# Patient Record
Sex: Female | Born: 1955 | Race: White | Hispanic: No | Marital: Married | Smoking: Former smoker
Health system: Southern US, Community
[De-identification: ages and names within clinical notes are randomized; demographics above are authoritative.]

## PROBLEM LIST (undated history)

## (undated) DIAGNOSIS — M1990 Unspecified osteoarthritis, unspecified site: Secondary | ICD-10-CM

## (undated) DIAGNOSIS — F32A Depression, unspecified: Secondary | ICD-10-CM

## (undated) DIAGNOSIS — J302 Other seasonal allergic rhinitis: Secondary | ICD-10-CM

## (undated) DIAGNOSIS — E785 Hyperlipidemia, unspecified: Secondary | ICD-10-CM

## (undated) DIAGNOSIS — L4050 Arthropathic psoriasis, unspecified: Secondary | ICD-10-CM

## (undated) DIAGNOSIS — H9190 Unspecified hearing loss, unspecified ear: Secondary | ICD-10-CM

## (undated) DIAGNOSIS — F419 Anxiety disorder, unspecified: Secondary | ICD-10-CM

## (undated) DIAGNOSIS — R011 Cardiac murmur, unspecified: Secondary | ICD-10-CM

## (undated) DIAGNOSIS — R7303 Prediabetes: Secondary | ICD-10-CM

## (undated) DIAGNOSIS — L409 Psoriasis, unspecified: Secondary | ICD-10-CM

## (undated) DIAGNOSIS — G4730 Sleep apnea, unspecified: Secondary | ICD-10-CM

## (undated) DIAGNOSIS — E119 Type 2 diabetes mellitus without complications: Secondary | ICD-10-CM

## (undated) DIAGNOSIS — Z87442 Personal history of urinary calculi: Secondary | ICD-10-CM

## (undated) DIAGNOSIS — I10 Essential (primary) hypertension: Secondary | ICD-10-CM

## (undated) HISTORY — PX: EXCISION OF TONGUE LESION: SHX6434

## (undated) HISTORY — PX: TUBAL LIGATION: SHX77

## (undated) HISTORY — PX: ABDOMINAL HYSTERECTOMY: SHX81

## (undated) HISTORY — PX: JOINT REPLACEMENT: SHX530

## (undated) HISTORY — PX: CHOLECYSTECTOMY: SHX55

## (undated) HISTORY — DX: Hyperlipidemia, unspecified: E78.5

## (undated) HISTORY — PX: TONSILLECTOMY: SUR1361

## (undated) HISTORY — DX: Type 2 diabetes mellitus without complications: E11.9

---

## 2000-04-11 ENCOUNTER — Encounter: Payer: Self-pay | Admitting: Specialist

## 2000-04-18 ENCOUNTER — Encounter: Payer: Self-pay | Admitting: Specialist

## 2000-04-18 ENCOUNTER — Inpatient Hospital Stay (HOSPITAL_COMMUNITY): Admission: RE | Admit: 2000-04-18 | Discharge: 2000-04-21 | Payer: Self-pay | Admitting: Specialist

## 2000-07-13 ENCOUNTER — Encounter: Payer: Self-pay | Admitting: Specialist

## 2000-07-20 ENCOUNTER — Encounter: Payer: Self-pay | Admitting: Specialist

## 2000-07-20 ENCOUNTER — Inpatient Hospital Stay (HOSPITAL_COMMUNITY): Admission: RE | Admit: 2000-07-20 | Discharge: 2000-07-23 | Payer: Self-pay | Admitting: Specialist

## 2016-11-10 ENCOUNTER — Other Ambulatory Visit (HOSPITAL_COMMUNITY): Payer: Self-pay | Admitting: Specialist

## 2016-11-10 DIAGNOSIS — Z96651 Presence of right artificial knee joint: Secondary | ICD-10-CM

## 2016-11-24 ENCOUNTER — Ambulatory Visit (HOSPITAL_COMMUNITY)
Admission: RE | Admit: 2016-11-24 | Discharge: 2016-11-24 | Disposition: A | Discharge status: Home / Self Care | Payer: BLUE CROSS/BLUE SHIELD | Source: Ambulatory Visit | Attending: Specialist | Admitting: Specialist

## 2016-11-24 DIAGNOSIS — Z96651 Presence of right artificial knee joint: Secondary | ICD-10-CM | POA: Insufficient documentation

## 2016-11-24 MED ORDER — TECHNETIUM TC 99M MEDRONATE IV KIT
25.0000 | PACK | Freq: Once | INTRAVENOUS | Status: AC | PRN
  Administered 2016-11-24: 25 via INTRAVENOUS

## 2016-12-21 ENCOUNTER — Ambulatory Visit: Payer: Self-pay | Admitting: Orthopedic Surgery

## 2016-12-31 ENCOUNTER — Ambulatory Visit: Payer: Self-pay | Admitting: Orthopedic Surgery

## 2017-01-02 ENCOUNTER — Ambulatory Visit: Payer: Self-pay | Admitting: Orthopedic Surgery

## 2017-01-02 NOTE — H&P (Signed)
TOTAL KNEE REVISION ADMISSION H&P  Patient is being admitted for right revision total knee arthroplasty.  Subjective:  Chief Complaint:right knee pain.  HPI: Laura Trujillo, 61 y.o. female, has a history of pain and functional disability in the right knee(s) due to failed previous arthroplasty and patient has failed non-surgical conservative treatments for greater than 12 weeks to include NSAID's and/or analgesics, flexibility and strengthening excercises, use of assistive devices and activity modification. The indications for the revision of the total knee arthroplasty are loosening of one or more components. Onset of symptoms was gradual starting 2 years ago with rapidlly worsening course since that time.  Prior procedures on the right knee(s) include arthroplasty.  Patient currently rates pain in the right knee(s) at 10 out of 10 with activity. There is night pain, worsening of pain with activity and weight bearing, pain that interferes with activities of daily living, pain with passive range of motion and joint swelling.  Patient has evidence of prosthetic loosening by imaging studies. This condition presents safety issues increasing the risk of falls.  There is no current active infection.  There are no active problems to display for this patient.  No past medical history on file.  No past surgical history on file.   (Not in a hospital admission) Allergies not on file  Social History  Substance Use Topics  . Smoking status: Not on file  . Smokeless tobacco: Not on file  . Alcohol use Not on file    No family history on file.    Review of Systems  Constitutional: Negative.   HENT: Positive for hearing loss.   Eyes: Negative.   Respiratory: Negative.   Cardiovascular: Negative.   Gastrointestinal: Negative.   Genitourinary: Negative.   Musculoskeletal: Positive for myalgias.  Skin: Positive for rash.  Neurological: Negative.   Endo/Heme/Allergies: Negative.     Psychiatric/Behavioral: Negative.      Objective:  Physical Exam  Vitals reviewed. Constitutional: She is oriented to person, place, and time. She appears well-developed and well-nourished.  HENT:  Head: Normocephalic and atraumatic.  Eyes: Pupils are equal, round, and reactive to light. Conjunctivae and EOM are normal.  Neck: Normal range of motion. Neck supple.  Cardiovascular: Normal rate, regular rhythm and intact distal pulses.   Respiratory: Effort normal. No respiratory distress.  GI: Soft. She exhibits no distension.  Genitourinary:  Genitourinary Comments: deferred  Musculoskeletal:       Right knee: She exhibits decreased range of motion and swelling. Tenderness found. Medial joint line and lateral joint line tenderness noted.       Legs: Neurological: She is alert and oriented to person, place, and time. She has normal reflexes.  Skin: Skin is warm and dry.  Psychiatric: She has a normal mood and affect. Her behavior is normal. Judgment and thought content normal.    Vital signs in last 24 hours: @VSRANGES @  Labs:  There is no height or weight on file to calculate BMI.  Imaging Review Plain radiographs demonstrate cemented replacement of the right knee(s). The overall alignment is neutral.There is evidence of loosening of the tibial components. The bone quality appears to be poor for age and reported activity level.   Assessment/Plan:  End stage arthritis, right knee(s) with failed previous arthroplasty.   The patient history, physical examination, clinical judgment of the provider and imaging studies are consistent with end stage degenerative joint disease of the right knee(s), previous total knee arthroplasty. Revision total knee arthroplasty is deemed medically necessary. The  treatment options including medical management, injection therapy, arthroscopy and revision arthroplasty were discussed at length. The risks and benefits of revision total knee arthroplasty  were presented and reviewed. The risks due to aseptic loosening, infection, stiffness, patella tracking problems, thromboembolic complications and other imponderables were discussed. The patient acknowledged the explanation, agreed to proceed with the plan and consent was signed. Patient is being admitted for inpatient treatment for surgery, pain control, PT, OT, prophylactic antibiotics, VTE prophylaxis, progressive ambulation and ADL's and discharge planning.The patient is planning to be discharged home with home health services

## 2017-01-24 NOTE — Pre-Procedure Instructions (Addendum)
Laura Trujillo  01/24/2017      [facility] - [place], [place] - [address] [place] [address] Phone: [phone] Fax: [phone]    Your procedure is scheduled on  Monday, August 20th   Report to [facility] Admitting at 11:15 AM             (posted surgery time 1:15p - 4:15p)   Call this number if you have problems the morning of surgery:  [phone], for other questions call [phone] Mon-Fri from 8a-4p   Remember:   Do not eat food or drink liquids after midnight, Sunday.              4-5 days prior to surgery, STOP TAKING any Vitamins, Herbal Supplements, Anti-Inflammatories.   Take these medicines the morning of surgery with A SIP OF WATER : Xanax. Please use your inhaler that morning.             You will NOT TAKE any diabetes medication that morning.   Do not wear jewelry, make-up or nail polish.  Do not wear lotions, powders, perfumes, or deoderant.  Do not shave 48 hours prior to surgery.     Do not bring valuables to the hospital.  [facility] is not responsible for any belongings or valuables.  Contacts, dentures or bridgework may not be worn into surgery.  Leave your suitcase in the car.  After surgery it may be brought to your room.  For patients admitted to the hospital, discharge time will be determined by your treatment team.  Please read over the following fact sheets that you were given. Pain Booklet, MRSA Information and Surgical Site Infection Prevention       [facility]- Preparing For Surgery  Before surgery, you can play an important role. Because skin is not sterile, your skin needs to be as free of germs as possible. You can reduce the number of germs on your skin by washing with CHG (chlorahexidine gluconate) Soap before surgery.  CHG is an antiseptic cleaner which kills germs and bonds with the skin to continue killing germs even after washing.  Please do not use if you have an  allergy to CHG or antibacterial soaps. If your skin becomes reddened/irritated stop using the CHG.  Do not shave (including legs and underarms) for at least 48 hours prior to first CHG shower. It is OK to shave your face.  Please follow these instructions carefully.   1. Shower the NIGHT BEFORE SURGERY and the MORNING OF SURGERY with CHG.   2. If you chose to wash your hair, wash your hair first as usual with your normal shampoo.  3. After you shampoo, rinse your hair and body thoroughly to remove the shampoo.  4. Use CHG as you would any other liquid soap. You can apply CHG directly to the skin and wash gently with a scrungie or a clean washcloth.   5. Apply the CHG Soap to your body ONLY FROM THE NECK DOWN.  Do not use on open wounds or open sores. Avoid contact with your eyes, ears, mouth and genitals (private parts). Wash genitals (private parts) with your normal soap.  6. Wash thoroughly, paying special attention to the area where your surgery will be performed.  7. Thoroughly rinse your body with warm water from the neck down.  8. DO NOT shower/wash with your normal soap after using and rinsing off the CHG Soap.  9. Dennie Bible  yourself dry with a CLEAN TOWEL.   10. Wear CLEAN PAJAMAS   11. Place CLEAN SHEETS on your bed the night of your first shower and DO NOT SLEEP WITH PETS.    Day of Surgery: Do not apply any deodorants/lotions. Please wear clean clothes to the hospital/surgery center.

## 2017-01-24 NOTE — Pre-Procedure Instructions (Signed)
Laura Trujillo  01/24/2017      [facility] - [place], [place] - [address] [place] [address] Phone: [phone] Fax: [phone]    Your procedure is scheduled on Aug 20  Report to [facility] Admitting at 1115 A.M.  Call this number if you have problems the morning of surgery:  [phone]   Remember:  Do not eat food or drink liquids after midnight.  Take these medicines the morning of surgery with A SIP OF WATER  Albuterol inhaler if needed, Xanax if needed, Flexeril if needed   Bring inhalers with you on the day of surgery, Stop taking aspirin, as directed by your Dr. Stop taking BC's, Goody's, Herbal medications, Fish Oil, Aleve, Naproxen,  Ibuprofen, Advil, Motrin    How to Manage Your Diabetes Before and After Surgery  Why is it important to control my blood sugar before and after surgery? . Improving blood sugar levels before and after surgery helps healing and can limit problems. . A way of improving blood sugar control is eating a healthy diet by: o  Eating less sugar and carbohydrates o  Increasing activity/exercise o  Talking with your doctor about reaching your blood sugar goals . High blood sugars (greater than 180 mg/dL) can raise your risk of infections and slow your recovery, so you will need to focus on controlling your diabetes during the weeks before surgery. . Make sure that the doctor who takes care of your diabetes knows about your planned surgery including the date and location.  How do I manage my blood sugar before surgery? . Check your blood sugar at least 4 times a day, starting 2 days before surgery, to make sure that the level is not too high or low. o Check your blood sugar the morning of your surgery when you wake up and every 2 hours until you get to the Short Stay unit. . If your blood sugar is less than 70 mg/dL, you will need to treat for low blood sugar: o Do not take  insulin. o Treat a low blood sugar (less than 70 mg/dL) with  cup of clear juice (cranberry or apple), 4 glucose tablets, OR glucose gel. o Recheck blood sugar in 15 minutes after treatment (to make sure it is greater than 70 mg/dL). If your blood sugar is not greater than 70 mg/dL on recheck, call [phone] for further instructions. . Report your blood sugar to the short stay nurse when you get to Short Stay.  . If you are admitted to the hospital after surgery: o Your blood sugar will be checked by the staff and you will probably be given insulin after surgery (instead of oral diabetes medicines) to make sure you have good blood sugar levels. o The goal for blood sugar control after surgery is 80-180 mg/dL.              WHAT DO I DO ABOUT MY DIABETES MEDICATION?   Marland Kitchen Do not take oral diabetes medicines (pills) the morning of surgery. Glipizide (Glucotrol XL) and metformin (Glucophage)  . Take only morning or lunch dose of Glipizide (Glucotrol XL)  . THE NIGHT BEFORE SURGERY, take ___________ units of ___________insulin.       Marland Kitchen HE MORNING OF SURGERY, take _____________ units of __________insulin.  . The day of surgery, do not take other diabetes injectables, including Byetta (exenatide), Bydureon (exenatide ER), Victoza (liraglutide), or Trulicity (dulaglutide).  . If your CBG is greater  than 220 mg/dL, you may take  of your sliding scale (correction) dose of insulin.  Other Instructions:          Patient Signature:  Date:   Nurse Signature:  Date:   Reviewed and Endorsed by [facility] Patient Education Committee, August 2015  Do not wear jewelry, make-up or nail polish.  Do not wear lotions, powders, or perfumes, or deoderant.  Do not shave 48 hours prior to surgery.  Men may shave face and neck.  Do not bring valuables to the hospital.  [facility] is not responsible for any belongings or valuables.  Contacts, dentures or bridgework may not be worn into  surgery.  Leave your suitcase in the car.  After surgery it may be brought to your room.  For patients admitted to the hospital, discharge time will be determined by your treatment team.  Patients discharged the day of surgery will not be allowed to drive home.    Special instructions:  [place] - Preparing for Surgery  Before surgery, you can play an important role.  Because skin is not sterile, your skin needs to be as free of germs as possible.  You can reduce the number of germs on you skin by washing with CHG (chlorahexidine gluconate) soap before surgery.  CHG is an antiseptic cleaner which kills germs and bonds with the skin to continue killing germs even after washing.  Please DO NOT use if you have an allergy to CHG or antibacterial soaps.  If your skin becomes reddened/irritated stop using the CHG and inform your nurse when you arrive at Short Stay.  Do not shave (including legs and underarms) for at least 48 hours prior to the first CHG shower.  You may shave your face.  Please follow these instructions carefully:   1.  Shower with CHG Soap the night before surgery and the                                morning of Surgery.  2.  If you choose to wash your hair, wash your hair first as usual with your       normal shampoo.  3.  After you shampoo, rinse your hair and body thoroughly to remove the                      Shampoo.  4.  Use CHG as you would any other liquid soap.  You can apply chg directly       to the skin and wash gently with scrungie or a clean washcloth.  5.  Apply the CHG Soap to your body ONLY FROM THE NECK DOWN.        Do not use on open wounds or open sores.  Avoid contact with your eyes,       ears, mouth and genitals (private parts).  Wash genitals (private parts)       with your normal soap.  6.  Wash thoroughly, paying special attention to the area where your surgery        will be performed.  7.  Thoroughly rinse your body with warm water from the neck  down.  8.  DO NOT shower/wash with your normal soap after using and rinsing off       the CHG Soap.  9.  Pat yourself dry with a clean towel.  10.  Wear clean pajamas.            11.  Place clean sheets on your bed the night of your first shower and do not        sleep with pets.  Day of Surgery  Do not apply any lotions/deoderants the morning of surgery.  Please wear clean clothes to the hospital/surgery center.     Please read over the following fact sheets that you were given. Pain Booklet, Coughing and Deep Breathing, MRSA Information and Surgical Site Infection Prevention

## 2017-01-25 ENCOUNTER — Encounter (HOSPITAL_COMMUNITY)
Admission: RE | Admit: 2017-01-25 | Discharge: 2017-01-25 | Disposition: A | Discharge status: Home / Self Care | Payer: BLUE CROSS/BLUE SHIELD | Source: Ambulatory Visit | Attending: Orthopedic Surgery | Admitting: Orthopedic Surgery

## 2017-01-25 ENCOUNTER — Encounter (HOSPITAL_COMMUNITY): Payer: Self-pay

## 2017-01-25 DIAGNOSIS — H918X1 Other specified hearing loss, right ear: Secondary | ICD-10-CM | POA: Insufficient documentation

## 2017-01-25 DIAGNOSIS — M1990 Unspecified osteoarthritis, unspecified site: Secondary | ICD-10-CM | POA: Diagnosis not present

## 2017-01-25 DIAGNOSIS — Z89512 Acquired absence of left leg below knee: Secondary | ICD-10-CM | POA: Insufficient documentation

## 2017-01-25 DIAGNOSIS — Z90710 Acquired absence of both cervix and uterus: Secondary | ICD-10-CM | POA: Diagnosis not present

## 2017-01-25 DIAGNOSIS — Z6834 Body mass index (BMI) 34.0-34.9, adult: Secondary | ICD-10-CM | POA: Insufficient documentation

## 2017-01-25 DIAGNOSIS — Z7902 Long term (current) use of antithrombotics/antiplatelets: Secondary | ICD-10-CM | POA: Diagnosis not present

## 2017-01-25 DIAGNOSIS — G4733 Obstructive sleep apnea (adult) (pediatric): Secondary | ICD-10-CM | POA: Diagnosis not present

## 2017-01-25 DIAGNOSIS — E669 Obesity, unspecified: Secondary | ICD-10-CM | POA: Insufficient documentation

## 2017-01-25 DIAGNOSIS — Z9049 Acquired absence of other specified parts of digestive tract: Secondary | ICD-10-CM | POA: Diagnosis not present

## 2017-01-25 DIAGNOSIS — I10 Essential (primary) hypertension: Secondary | ICD-10-CM | POA: Insufficient documentation

## 2017-01-25 DIAGNOSIS — Z01818 Encounter for other preprocedural examination: Secondary | ICD-10-CM | POA: Insufficient documentation

## 2017-01-25 DIAGNOSIS — L4050 Arthropathic psoriasis, unspecified: Secondary | ICD-10-CM | POA: Insufficient documentation

## 2017-01-25 DIAGNOSIS — Z7982 Long term (current) use of aspirin: Secondary | ICD-10-CM | POA: Insufficient documentation

## 2017-01-25 DIAGNOSIS — E119 Type 2 diabetes mellitus without complications: Secondary | ICD-10-CM | POA: Insufficient documentation

## 2017-01-25 DIAGNOSIS — Z89511 Acquired absence of right leg below knee: Secondary | ICD-10-CM | POA: Insufficient documentation

## 2017-01-25 HISTORY — DX: Unspecified osteoarthritis, unspecified site: M19.90

## 2017-01-25 HISTORY — DX: Essential (primary) hypertension: I10

## 2017-01-25 HISTORY — DX: Psoriasis, unspecified: L40.9

## 2017-01-25 HISTORY — DX: Sleep apnea, unspecified: G47.30

## 2017-01-25 HISTORY — DX: Other seasonal allergic rhinitis: J30.2

## 2017-01-25 HISTORY — DX: Unspecified hearing loss, unspecified ear: H91.90

## 2017-01-25 HISTORY — DX: Arthropathic psoriasis, unspecified: L40.50

## 2017-01-25 HISTORY — DX: Cardiac murmur, unspecified: R01.1

## 2017-01-25 HISTORY — DX: Prediabetes: R73.03

## 2017-01-25 LAB — SURGICAL PCR SCREEN
MRSA, PCR: NEGATIVE
Staphylococcus aureus: NEGATIVE

## 2017-01-25 LAB — BASIC METABOLIC PANEL
Anion gap: 8 (ref 5–15)
BUN: 19 mg/dL (ref 6–20)
CALCIUM: 9.7 mg/dL (ref 8.9–10.3)
CO2: 27 mmol/L (ref 22–32)
CREATININE: 0.88 mg/dL (ref 0.44–1.00)
Chloride: 105 mmol/L (ref 101–111)
GFR calc non Af Amer: >60 mL/min (ref >60)
Glucose, Bld: 143 mg/dL — ABNORMAL HIGH (ref 65–99)
Potassium: 4.2 mmol/L (ref 3.5–5.1)
SODIUM: 140 mmol/L (ref 135–145)

## 2017-01-25 LAB — GLUCOSE, CAPILLARY: GLUCOSE-CAPILLARY: 133 mg/dL — AB (ref 65–99)

## 2017-01-25 LAB — CBC
HCT: 44.7 % (ref 36.0–46.0)
Hemoglobin: 14.9 g/dL (ref 12.0–15.0)
MCH: 28.5 pg (ref 26.0–34.0)
MCHC: 33.3 g/dL (ref 30.0–36.0)
MCV: 85.6 fL (ref 78.0–100.0)
PLATELETS: 264 10*3/uL (ref 150–400)
RBC: 5.22 MIL/uL — AB (ref 3.87–5.11)
RDW: 14.9 % (ref 11.5–15.5)
WBC: 8.3 10*3/uL (ref 4.0–10.5)

## 2017-01-25 LAB — TYPE AND SCREEN
ABO/RH(D): AB POS
ANTIBODY SCREEN: NEGATIVE

## 2017-01-25 NOTE — Progress Notes (Signed)
PCP is Loleta DickerErin Judge, NP  LOV 12/2016 Was told she had "slight murmur" Denies any sx, no sob, cp. Dr. Jenne PaneAdnan Javid did the Sleep Studies 10/2016,  [facility] [place] for [place]. Had been told she is "Pre" diabetic.  Will check her sugar maybe twice a month.  Ranges between 100-120.  Last A1C was 6.6 in 12/2016 She does go see a Seymour BarsKaren Bowen - nutrisionist at [facility].  I have requested a copy of her EKG from this facility.

## 2017-01-25 NOTE — Progress Notes (Signed)
Anesthesia Chart Review: Patient is a 61 year old female scheduled for revision right TKA on 01/29/17 by Dr. Linna CapriceSwinteck.  History includes former smoker (quit '07), murmur (patient reported subtle murmur that is only intermittently heard; has never been told it needed to be evaluated. Denied CP, SOB, syncope, edema. 12/25/16 note by PCP Loleta DickerErin Judge, NP documented "negative murmur"), HTN, OSA, arthritis, DM2, hearing loss (right), psoriatic arthritis, psoriasis, tonsillectomy, hysterectomy, cholecystectomy, excision tongue lesion, bilateral BKA. BMI is consistent with obesity.  PCP is Loleta DickerErin Judge, FNP at [facility] (see Care Everywhere). She was seen for DM follow-up prior to surgery. Surgeon was wanting A1 < 7.5. Patient went tot he bariatric clinic (Dr. Seymour BarsKaren Bowen) and was started on Qsymia, but this was discontinued due to constipation. Patient was able to lose 20 lbs. Follow-up A1c down to 6.6.    Meds include albuterol, Xanax, aspirin 81 mg, Lipitor, biotin, calcium-magnesium, Flexeril, flaxseed oil, folic acid, glipizide, lisinopril, lutein, melatonin, metformin, methotrexate.  BP 138/75   Pulse 69   Temp 37.1 C   Resp 20   Ht 5' 6.5" (1.689 m)   Wt 216 lb 8 oz (98.2 kg)   SpO2 100%   BMI 34.42 kg/m   EKG 11/14/16 [facility]): SR, low voltage, possible pulmonary disease pattern. TRACING REQUESTED.  Preoperative labs noted. Glucose 143. Cr 0.88. H/H 14.9/44.7. T&S done. A1c on 12/25/16 was 6.6 [facility] Care Everywhere).  I called an re-requested EKG tracing from [facility]. Based on result narrative and improved A1c results then I would anticipate that she can proceed as planned if no acute changes. If EKG tracing not received then she will need one on the day of surgery.  Velna Ochsllison Zelenak, PA-C [facility] Short Stay Center/Anesthesiology Phone [phone] 01/26/2017 2:23 PM

## 2017-01-26 LAB — ABO/RH: ABO/RH(D): AB POS

## 2017-01-26 MED ORDER — SODIUM CHLORIDE 0.9 % IV SOLN
1000.0000 mg | INTRAVENOUS | Status: AC
  Administered 2017-01-29: 1000 mg via INTRAVENOUS
  Filled 2017-01-26: qty 1100

## 2017-01-29 ENCOUNTER — Encounter (HOSPITAL_COMMUNITY): Payer: Self-pay

## 2017-01-29 ENCOUNTER — Inpatient Hospital Stay (HOSPITAL_COMMUNITY): Payer: BLUE CROSS/BLUE SHIELD | Admitting: Anesthesiology

## 2017-01-29 ENCOUNTER — Inpatient Hospital Stay (HOSPITAL_COMMUNITY)
Admission: RE | Admit: 2017-01-29 | Discharge: 2017-01-31 | DRG: 468 | Disposition: A | Discharge status: Home Health | Payer: BLUE CROSS/BLUE SHIELD | Source: Ambulatory Visit | Attending: Orthopedic Surgery | Admitting: Orthopedic Surgery

## 2017-01-29 ENCOUNTER — Inpatient Hospital Stay (HOSPITAL_COMMUNITY): Payer: BLUE CROSS/BLUE SHIELD | Admitting: Vascular Surgery

## 2017-01-29 ENCOUNTER — Encounter (HOSPITAL_COMMUNITY)
Admission: RE | Disposition: A | Discharge status: Home Health | Payer: Self-pay | Source: Ambulatory Visit | Attending: Orthopedic Surgery

## 2017-01-29 ENCOUNTER — Inpatient Hospital Stay (HOSPITAL_COMMUNITY): Payer: BLUE CROSS/BLUE SHIELD

## 2017-01-29 DIAGNOSIS — T84032A Mechanical loosening of internal right knee prosthetic joint, initial encounter: Principal | ICD-10-CM | POA: Diagnosis present

## 2017-01-29 DIAGNOSIS — E119 Type 2 diabetes mellitus without complications: Secondary | ICD-10-CM | POA: Diagnosis present

## 2017-01-29 DIAGNOSIS — I10 Essential (primary) hypertension: Secondary | ICD-10-CM | POA: Diagnosis present

## 2017-01-29 DIAGNOSIS — H9191 Unspecified hearing loss, right ear: Secondary | ICD-10-CM | POA: Diagnosis present

## 2017-01-29 DIAGNOSIS — Z7984 Long term (current) use of oral hypoglycemic drugs: Secondary | ICD-10-CM | POA: Diagnosis not present

## 2017-01-29 DIAGNOSIS — L4050 Arthropathic psoriasis, unspecified: Secondary | ICD-10-CM | POA: Diagnosis present

## 2017-01-29 DIAGNOSIS — Y792 Prosthetic and other implants, materials and accessory orthopedic devices associated with adverse incidents: Secondary | ICD-10-CM | POA: Diagnosis present

## 2017-01-29 DIAGNOSIS — G4730 Sleep apnea, unspecified: Secondary | ICD-10-CM | POA: Diagnosis present

## 2017-01-29 DIAGNOSIS — T84092A Other mechanical complication of internal right knee prosthesis, initial encounter: Secondary | ICD-10-CM | POA: Diagnosis present

## 2017-01-29 DIAGNOSIS — T84012A Broken internal right knee prosthesis, initial encounter: Secondary | ICD-10-CM

## 2017-01-29 DIAGNOSIS — Z9181 History of falling: Secondary | ICD-10-CM

## 2017-01-29 DIAGNOSIS — Z87891 Personal history of nicotine dependence: Secondary | ICD-10-CM

## 2017-01-29 DIAGNOSIS — M1711 Unilateral primary osteoarthritis, right knee: Secondary | ICD-10-CM | POA: Diagnosis present

## 2017-01-29 DIAGNOSIS — Z09 Encounter for follow-up examination after completed treatment for conditions other than malignant neoplasm: Secondary | ICD-10-CM

## 2017-01-29 HISTORY — PX: TOTAL KNEE REVISION: SHX996

## 2017-01-29 LAB — GLUCOSE, CAPILLARY
GLUCOSE-CAPILLARY: 121 mg/dL — AB (ref 65–99)
GLUCOSE-CAPILLARY: 173 mg/dL — AB (ref 65–99)
Glucose-Capillary: 104 mg/dL — ABNORMAL HIGH (ref 65–99)

## 2017-01-29 SURGERY — TOTAL KNEE REVISION
Anesthesia: Spinal | Site: Knee | Laterality: Right

## 2017-01-29 MED ORDER — KETOROLAC TROMETHAMINE 15 MG/ML IJ SOLN
15.0000 mg | Freq: Four times a day (QID) | INTRAMUSCULAR | Status: AC
  Administered 2017-01-29 – 2017-01-30 (×3): 15 mg via INTRAVENOUS
  Filled 2017-01-29 (×3): qty 1

## 2017-01-29 MED ORDER — KETOROLAC TROMETHAMINE 30 MG/ML IJ SOLN
INTRAMUSCULAR | Status: AC
  Filled 2017-01-29: qty 1

## 2017-01-29 MED ORDER — METHOCARBAMOL 1000 MG/10ML IJ SOLN
500.0000 mg | Freq: Four times a day (QID) | INTRAVENOUS | Status: DC | PRN
  Filled 2017-01-29: qty 5

## 2017-01-29 MED ORDER — HYDROMORPHONE HCL 1 MG/ML IJ SOLN
INTRAMUSCULAR | Status: AC
  Administered 2017-01-29: 0.5 mg via INTRAVENOUS
  Filled 2017-01-29: qty 1

## 2017-01-29 MED ORDER — OXYCODONE HCL 5 MG PO TABS
5.0000 mg | ORAL_TABLET | Freq: Once | ORAL | Status: AC | PRN
  Administered 2017-01-29: 5 mg via ORAL

## 2017-01-29 MED ORDER — PHENYLEPHRINE 40 MCG/ML (10ML) SYRINGE FOR IV PUSH (FOR BLOOD PRESSURE SUPPORT)
PREFILLED_SYRINGE | INTRAVENOUS | Status: AC
  Filled 2017-01-29: qty 10

## 2017-01-29 MED ORDER — DEXAMETHASONE SODIUM PHOSPHATE 10 MG/ML IJ SOLN
10.0000 mg | Freq: Once | INTRAMUSCULAR | Status: AC
  Administered 2017-01-30: 10 mg via INTRAVENOUS
  Filled 2017-01-29: qty 1

## 2017-01-29 MED ORDER — OXYCODONE HCL 5 MG PO TABS
ORAL_TABLET | ORAL | Status: AC
  Administered 2017-01-29: 5 mg via ORAL
  Filled 2017-01-29: qty 1

## 2017-01-29 MED ORDER — POVIDONE-IODINE 10 % EX SWAB: 2.0000 "application " | Freq: Once | CUTANEOUS | Status: DC

## 2017-01-29 MED ORDER — SODIUM CHLORIDE 0.9 % IV SOLN: INTRAVENOUS | Status: DC

## 2017-01-29 MED ORDER — PHENYLEPHRINE HCL 10 MG/ML IJ SOLN
INTRAVENOUS | Status: DC | PRN
  Administered 2017-01-29: 50 ug/min via INTRAVENOUS

## 2017-01-29 MED ORDER — INSULIN ASPART 100 UNIT/ML ~~LOC~~ SOLN: 0.0000 [IU] | Freq: Three times a day (TID) | SUBCUTANEOUS | Status: DC

## 2017-01-29 MED ORDER — CLINDAMYCIN PHOSPHATE 900 MG/50ML IV SOLN
900.0000 mg | INTRAVENOUS | Status: AC
Start: 2017-01-29 — End: 2017-01-29
  Administered 2017-01-29: 900 mg via INTRAVENOUS
  Filled 2017-01-29: qty 50

## 2017-01-29 MED ORDER — LACTATED RINGERS IV SOLN
INTRAVENOUS | Status: DC
  Administered 2017-01-29 (×2): via INTRAVENOUS

## 2017-01-29 MED ORDER — SODIUM CHLORIDE 0.9 % IJ SOLN
INTRAMUSCULAR | Status: DC | PRN
  Administered 2017-01-29: 50 mL

## 2017-01-29 MED ORDER — KETOROLAC TROMETHAMINE 30 MG/ML IJ SOLN
INTRAMUSCULAR | Status: DC | PRN
  Administered 2017-01-29: 30 mg

## 2017-01-29 MED ORDER — FENTANYL CITRATE (PF) 250 MCG/5ML IJ SOLN
INTRAMUSCULAR | Status: DC | PRN
  Administered 2017-01-29: 75 ug via INTRAVENOUS
  Administered 2017-01-29 (×4): 25 ug via INTRAVENOUS
  Administered 2017-01-29: 50 ug via INTRAVENOUS
  Administered 2017-01-29: 25 ug via INTRAVENOUS

## 2017-01-29 MED ORDER — ALUM & MAG HYDROXIDE-SIMETH 200-200-20 MG/5ML PO SUSP: 30.0000 mL | ORAL | Status: DC | PRN

## 2017-01-29 MED ORDER — HYDROMORPHONE HCL 1 MG/ML IJ SOLN
0.2500 mg | INTRAMUSCULAR | Status: DC | PRN
  Administered 2017-01-29 (×4): 0.5 mg via INTRAVENOUS

## 2017-01-29 MED ORDER — PROPOFOL 10 MG/ML IV BOLUS
INTRAVENOUS | Status: AC
  Filled 2017-01-29: qty 20

## 2017-01-29 MED ORDER — METHOCARBAMOL 500 MG PO TABS
500.0000 mg | ORAL_TABLET | Freq: Four times a day (QID) | ORAL | Status: DC | PRN
  Administered 2017-01-29 – 2017-01-31 (×3): 500 mg via ORAL
  Filled 2017-01-29 (×3): qty 1

## 2017-01-29 MED ORDER — FOLIC ACID 1 MG PO TABS
1.0000 mg | ORAL_TABLET | Freq: Every day | ORAL | Status: DC
  Administered 2017-01-30 – 2017-01-31 (×2): 1 mg via ORAL
  Filled 2017-01-29 (×2): qty 1

## 2017-01-29 MED ORDER — METOCLOPRAMIDE HCL 5 MG PO TABS: 5.0000 mg | ORAL_TABLET | Freq: Three times a day (TID) | ORAL | Status: DC | PRN

## 2017-01-29 MED ORDER — CHLORHEXIDINE GLUCONATE 4 % EX LIQD: 60.0000 mL | Freq: Once | CUTANEOUS | Status: DC

## 2017-01-29 MED ORDER — MIDAZOLAM HCL 2 MG/2ML IJ SOLN
INTRAMUSCULAR | Status: DC | PRN
  Administered 2017-01-29: 2 mg via INTRAVENOUS

## 2017-01-29 MED ORDER — ALBUTEROL SULFATE (2.5 MG/3ML) 0.083% IN NEBU: 3.0000 mL | INHALATION_SOLUTION | RESPIRATORY_TRACT | Status: DC | PRN

## 2017-01-29 MED ORDER — DEXAMETHASONE SODIUM PHOSPHATE 10 MG/ML IJ SOLN
INTRAMUSCULAR | Status: DC | PRN
  Administered 2017-01-29: 5 mg via INTRAVENOUS

## 2017-01-29 MED ORDER — APIXABAN 2.5 MG PO TABS
2.5000 mg | ORAL_TABLET | Freq: Two times a day (BID) | ORAL | Status: DC
  Administered 2017-01-30 – 2017-01-31 (×3): 2.5 mg via ORAL
  Filled 2017-01-29 (×3): qty 1

## 2017-01-29 MED ORDER — MENTHOL 3 MG MT LOZG: 1.0000 | LOZENGE | OROMUCOSAL | Status: DC | PRN

## 2017-01-29 MED ORDER — TRANEXAMIC ACID 1000 MG/10ML IV SOLN
1000.0000 mg | Freq: Once | INTRAVENOUS | Status: AC
  Administered 2017-01-29: 1000 mg via INTRAVENOUS
  Filled 2017-01-29: qty 10

## 2017-01-29 MED ORDER — KETAMINE HCL 10 MG/ML IJ SOLN
INTRAMUSCULAR | Status: DC | PRN
  Administered 2017-01-29 (×2): 10 mg via INTRAVENOUS

## 2017-01-29 MED ORDER — BIOTIN 5000 MCG PO TABS: 1.0000 | ORAL_TABLET | Freq: Every day | ORAL | Status: DC

## 2017-01-29 MED ORDER — ACETAMINOPHEN 10 MG/ML IV SOLN: 1000.0000 mg | INTRAVENOUS | Status: DC

## 2017-01-29 MED ORDER — ONDANSETRON HCL 4 MG/2ML IJ SOLN
INTRAMUSCULAR | Status: DC | PRN
  Administered 2017-01-29: 4 mg via INTRAVENOUS

## 2017-01-29 MED ORDER — DOCUSATE SODIUM 100 MG PO CAPS
100.0000 mg | ORAL_CAPSULE | Freq: Two times a day (BID) | ORAL | Status: DC
  Administered 2017-01-29 – 2017-01-31 (×4): 100 mg via ORAL
  Filled 2017-01-29 (×4): qty 1

## 2017-01-29 MED ORDER — BUPIVACAINE-EPINEPHRINE (PF) 0.5% -1:200000 IJ SOLN
INTRAMUSCULAR | Status: DC | PRN
  Administered 2017-01-29: 30 mL

## 2017-01-29 MED ORDER — PHENOL 1.4 % MT LIQD: 1.0000 | OROMUCOSAL | Status: DC | PRN

## 2017-01-29 MED ORDER — CEFAZOLIN SODIUM-DEXTROSE 2-4 GM/100ML-% IV SOLN: 2.0000 g | INTRAVENOUS | Status: DC

## 2017-01-29 MED ORDER — HYDROMORPHONE HCL 1 MG/ML IJ SOLN
0.5000 mg | INTRAMUSCULAR | Status: DC | PRN
  Administered 2017-01-29 – 2017-01-31 (×4): 1 mg via INTRAVENOUS
  Filled 2017-01-29 (×4): qty 1

## 2017-01-29 MED ORDER — EPHEDRINE 5 MG/ML INJ
INTRAVENOUS | Status: AC
  Filled 2017-01-29: qty 10

## 2017-01-29 MED ORDER — ACETAMINOPHEN 650 MG RE SUPP: 650.0000 mg | Freq: Four times a day (QID) | RECTAL | Status: DC | PRN

## 2017-01-29 MED ORDER — FENTANYL CITRATE (PF) 100 MCG/2ML IJ SOLN
100.0000 ug | Freq: Once | INTRAMUSCULAR | Status: AC
  Administered 2017-01-29: 100 ug via INTRAVENOUS

## 2017-01-29 MED ORDER — VANCOMYCIN HCL IN DEXTROSE 1-5 GM/200ML-% IV SOLN
1000.0000 mg | Freq: Two times a day (BID) | INTRAVENOUS | Status: AC
  Administered 2017-01-29: 1000 mg via INTRAVENOUS
  Filled 2017-01-29: qty 200

## 2017-01-29 MED ORDER — HYDROCODONE-ACETAMINOPHEN 5-325 MG PO TABS
1.0000 | ORAL_TABLET | ORAL | Status: DC | PRN
Start: 2017-01-29 — End: 2017-01-31
  Administered 2017-01-30: 1 via ORAL
  Administered 2017-01-30 – 2017-01-31 (×4): 2 via ORAL
  Filled 2017-01-29: qty 1
  Filled 2017-01-29 (×4): qty 2

## 2017-01-29 MED ORDER — VANCOMYCIN HCL IN DEXTROSE 1-5 GM/200ML-% IV SOLN
1000.0000 mg | INTRAVENOUS | Status: AC
  Administered 2017-01-29: 1000 mg via INTRAVENOUS

## 2017-01-29 MED ORDER — BUPIVACAINE-EPINEPHRINE (PF) 0.25% -1:200000 IJ SOLN
INTRAMUSCULAR | Status: AC
  Filled 2017-01-29: qty 30

## 2017-01-29 MED ORDER — GLIPIZIDE ER 5 MG PO TB24
5.0000 mg | ORAL_TABLET | Freq: Two times a day (BID) | ORAL | Status: DC
  Administered 2017-01-29 – 2017-01-31 (×4): 5 mg via ORAL
  Filled 2017-01-29 (×5): qty 1

## 2017-01-29 MED ORDER — ATORVASTATIN CALCIUM 10 MG PO TABS
5.0000 mg | ORAL_TABLET | Freq: Every day | ORAL | Status: DC
  Administered 2017-01-30 – 2017-01-31 (×2): 5 mg via ORAL
  Filled 2017-01-29 (×2): qty 1

## 2017-01-29 MED ORDER — KETAMINE HCL-SODIUM CHLORIDE 100-0.9 MG/10ML-% IV SOSY
PREFILLED_SYRINGE | INTRAVENOUS | Status: AC
  Filled 2017-01-29: qty 10

## 2017-01-29 MED ORDER — ONDANSETRON HCL 4 MG/2ML IJ SOLN
4.0000 mg | Freq: Four times a day (QID) | INTRAMUSCULAR | Status: DC | PRN
  Administered 2017-01-31: 4 mg via INTRAVENOUS
  Filled 2017-01-29: qty 2

## 2017-01-29 MED ORDER — METFORMIN HCL 500 MG PO TABS
1000.0000 mg | ORAL_TABLET | Freq: Every day | ORAL | Status: DC
  Administered 2017-01-30 – 2017-01-31 (×2): 1000 mg via ORAL
  Filled 2017-01-29 (×2): qty 2

## 2017-01-29 MED ORDER — MIDAZOLAM HCL 2 MG/2ML IJ SOLN
INTRAMUSCULAR | Status: AC
  Filled 2017-01-29: qty 2

## 2017-01-29 MED ORDER — SENNA 8.6 MG PO TABS
2.0000 | ORAL_TABLET | Freq: Every day | ORAL | Status: DC
  Filled 2017-01-29: qty 2

## 2017-01-29 MED ORDER — DEXAMETHASONE SODIUM PHOSPHATE 10 MG/ML IJ SOLN
INTRAMUSCULAR | Status: AC
  Filled 2017-01-29: qty 1

## 2017-01-29 MED ORDER — METOCLOPRAMIDE HCL 5 MG/ML IJ SOLN: 5.0000 mg | Freq: Three times a day (TID) | INTRAMUSCULAR | Status: DC | PRN

## 2017-01-29 MED ORDER — POLYETHYLENE GLYCOL 3350 17 G PO PACK: 17.0000 g | PACK | Freq: Every day | ORAL | Status: DC | PRN

## 2017-01-29 MED ORDER — OXYCODONE HCL 5 MG/5ML PO SOLN: 5.0000 mg | Freq: Once | ORAL | Status: AC | PRN

## 2017-01-29 MED ORDER — SODIUM CHLORIDE 0.9 % IV SOLN
INTRAVENOUS | Status: DC
  Administered 2017-01-29: 22:00:00 via INTRAVENOUS

## 2017-01-29 MED ORDER — DIPHENHYDRAMINE HCL 12.5 MG/5ML PO ELIX: 12.5000 mg | ORAL_SOLUTION | ORAL | Status: DC | PRN

## 2017-01-29 MED ORDER — MIDAZOLAM HCL 2 MG/2ML IJ SOLN
INTRAMUSCULAR | Status: AC
  Administered 2017-01-29: 2 mg via INTRAVENOUS
  Filled 2017-01-29: qty 2

## 2017-01-29 MED ORDER — ACETAMINOPHEN 325 MG PO TABS
650.0000 mg | ORAL_TABLET | Freq: Four times a day (QID) | ORAL | Status: DC | PRN
  Administered 2017-01-29 – 2017-01-31 (×2): 650 mg via ORAL
  Filled 2017-01-29 (×2): qty 2

## 2017-01-29 MED ORDER — EPHEDRINE SULFATE-NACL 50-0.9 MG/10ML-% IV SOSY
PREFILLED_SYRINGE | INTRAVENOUS | Status: DC | PRN
  Administered 2017-01-29: 5 mg via INTRAVENOUS
  Administered 2017-01-29 (×2): 10 mg via INTRAVENOUS

## 2017-01-29 MED ORDER — VANCOMYCIN HCL IN DEXTROSE 1-5 GM/200ML-% IV SOLN
INTRAVENOUS | Status: AC
  Administered 2017-01-29: 1000 mg via INTRAVENOUS
  Filled 2017-01-29: qty 200

## 2017-01-29 MED ORDER — BUPIVACAINE HCL (PF) 0.75 % IJ SOLN
INTRAMUSCULAR | Status: DC | PRN
  Administered 2017-01-29: 2 mL via INTRATHECAL

## 2017-01-29 MED ORDER — LISINOPRIL 5 MG PO TABS
5.0000 mg | ORAL_TABLET | ORAL | Status: DC
  Administered 2017-01-30 – 2017-01-31 (×2): 5 mg via ORAL
  Filled 2017-01-29 (×2): qty 1

## 2017-01-29 MED ORDER — VITAMIN B-12 1000 MCG PO TABS
1000.0000 ug | ORAL_TABLET | Freq: Every day | ORAL | Status: DC
  Administered 2017-01-30 – 2017-01-31 (×2): 1000 ug via ORAL
  Filled 2017-01-29 (×2): qty 1

## 2017-01-29 MED ORDER — MIDAZOLAM HCL 2 MG/2ML IJ SOLN
2.0000 mg | Freq: Once | INTRAMUSCULAR | Status: AC
  Administered 2017-01-29: 2 mg via INTRAVENOUS

## 2017-01-29 MED ORDER — METFORMIN HCL 500 MG PO TABS
500.0000 mg | ORAL_TABLET | Freq: Every day | ORAL | Status: DC
  Administered 2017-01-30: 500 mg via ORAL
  Filled 2017-01-29: qty 1

## 2017-01-29 MED ORDER — ONDANSETRON HCL 4 MG PO TABS: 4.0000 mg | ORAL_TABLET | Freq: Four times a day (QID) | ORAL | Status: DC | PRN

## 2017-01-29 MED ORDER — ACETAMINOPHEN 10 MG/ML IV SOLN
1000.0000 mg | INTRAVENOUS | Status: AC
  Administered 2017-01-29: 1000 mg via INTRAVENOUS
  Filled 2017-01-29: qty 100

## 2017-01-29 MED ORDER — PROMETHAZINE HCL 25 MG/ML IJ SOLN: 6.2500 mg | INTRAMUSCULAR | Status: DC | PRN

## 2017-01-29 MED ORDER — FENTANYL CITRATE (PF) 250 MCG/5ML IJ SOLN
INTRAMUSCULAR | Status: AC
  Filled 2017-01-29: qty 5

## 2017-01-29 MED ORDER — ROPIVACAINE HCL 5 MG/ML IJ SOLN
INTRAMUSCULAR | Status: DC | PRN
  Administered 2017-01-29: 20 mL via PERINEURAL

## 2017-01-29 MED ORDER — ALPRAZOLAM 0.5 MG PO TABS
0.5000 mg | ORAL_TABLET | Freq: Two times a day (BID) | ORAL | Status: DC | PRN
  Administered 2017-01-31: 0.5 mg via ORAL
  Filled 2017-01-29 (×3): qty 1

## 2017-01-29 MED ORDER — FENTANYL CITRATE (PF) 100 MCG/2ML IJ SOLN
INTRAMUSCULAR | Status: AC
  Administered 2017-01-29: 100 ug via INTRAVENOUS
  Filled 2017-01-29: qty 2

## 2017-01-29 MED ORDER — PHENYLEPHRINE 40 MCG/ML (10ML) SYRINGE FOR IV PUSH (FOR BLOOD PRESSURE SUPPORT)
PREFILLED_SYRINGE | INTRAVENOUS | Status: DC | PRN
  Administered 2017-01-29: 160 ug via INTRAVENOUS
  Administered 2017-01-29 (×2): 40 ug via INTRAVENOUS
  Administered 2017-01-29: 160 ug via INTRAVENOUS
  Administered 2017-01-29: 80 ug via INTRAVENOUS

## 2017-01-29 MED ORDER — ONDANSETRON HCL 4 MG/2ML IJ SOLN
INTRAMUSCULAR | Status: AC
  Filled 2017-01-29: qty 2

## 2017-01-29 MED ORDER — PROPOFOL 500 MG/50ML IV EMUL
INTRAVENOUS | Status: DC | PRN
  Administered 2017-01-29: 75 ug/kg/min via INTRAVENOUS

## 2017-01-29 SURGICAL SUPPLY — 72 items
ADAPTER FEM OFFST VANGUARD 2.5 (Knees) ×6 IMPLANT
ALCOHOL ISOPROPYL (RUBBING) (MISCELLANEOUS) ×3 IMPLANT
AUG VGRD FEM 65 RT (Joint) ×2 IMPLANT
AUG VGRD FEMORAL 65MM RT (Joint) ×1 IMPLANT
AUG VNGD BMT 360 TIB 71X5 (Knees) ×4 IMPLANT
AUG VNGD BMT 360 TIB 71X5MM (Knees) ×2 IMPLANT
AUGMENT TIBIAL 51X34 25MM RT (Knees) ×1 IMPLANT
AUGMENT VGRD FEM 65 RT (Joint) ×1 IMPLANT
AUGMENT VNGD BMT 360 TIB 71X5 (Knees) ×2 IMPLANT
BANDAGE ACE 6X5 VEL STRL LF (GAUZE/BANDAGES/DRESSINGS) ×3 IMPLANT
BANDAGE ESMARK 6X9 LF (GAUZE/BANDAGES/DRESSINGS) ×1 IMPLANT
BEARING TIBIAL 14X71/75 (Orthopedic Implant) ×2 IMPLANT
BEARING TIBIAL 14X71/75MM (Orthopedic Implant) ×1 IMPLANT
BLADE SAW RECIP 87.9 MT (BLADE) ×3 IMPLANT
BLADE SAW SAG 9X24 (BLADE) ×3 IMPLANT
BNDG ESMARK 6X9 LF (GAUZE/BANDAGES/DRESSINGS) ×3
BONE CEMENT PALACOS R-G (Cement) ×12 IMPLANT
BUR EGG ELITE 4.0 (BURR) ×2 IMPLANT
BUR EGG ELITE 4.0MM (BURR) ×1
BURR 6MM HEAD ROUND DIAMOND (BURR) ×3 IMPLANT
CANISTER WOUND CARE 500ML ATS (WOUND CARE) ×3 IMPLANT
CEMENT BONE PALACOS R-G (Cement) ×4 IMPLANT
CHLORAPREP W/TINT 26ML (MISCELLANEOUS) ×6 IMPLANT
CUFF TOURNIQUET SINGLE 34IN LL (TOURNIQUET CUFF) ×3 IMPLANT
DERMABOND ADVANCED (GAUZE/BANDAGES/DRESSINGS) ×2
DERMABOND ADVANCED .7 DNX12 (GAUZE/BANDAGES/DRESSINGS) ×1 IMPLANT
DRAIN HEMOVAC 7FR (DRAIN) ×3 IMPLANT
DRAPE HALF SHEET 40X57 (DRAPES) ×3 IMPLANT
DRAPE POUCH INSTRU U-SHP 10X18 (DRAPES) ×3 IMPLANT
DRAPE U-SHAPE 47X51 STRL (DRAPES) ×3 IMPLANT
DRESSING PREVENA PLUS CUSTOM (GAUZE/BANDAGES/DRESSINGS) ×1 IMPLANT
DRSG AQUACEL AG ADV 3.5X14 (GAUZE/BANDAGES/DRESSINGS) IMPLANT
DRSG MEPILEX BORDER 4X4 (GAUZE/BANDAGES/DRESSINGS) ×3 IMPLANT
DRSG PREVENA PLUS CUSTOM (GAUZE/BANDAGES/DRESSINGS) ×3
DRSG TEGADERM 2-3/8X2-3/4 SM (GAUZE/BANDAGES/DRESSINGS) IMPLANT
ELECT REM PT RETURN 9FT ADLT (ELECTROSURGICAL) ×3
ELECTRODE REM PT RTRN 9FT ADLT (ELECTROSURGICAL) ×1 IMPLANT
EVACUATOR 1/8 PVC DRAIN (DRAIN) IMPLANT
GLOVE BIO SURGEON STRL SZ8.5 (GLOVE) ×6 IMPLANT
GLOVE BIOGEL PI IND STRL 8.5 (GLOVE) ×1 IMPLANT
GLOVE BIOGEL PI INDICATOR 8.5 (GLOVE) ×2
GOWN STRL REUS W/TWL 2XL LVL3 (GOWN DISPOSABLE) ×3 IMPLANT
HANDPIECE INTERPULSE COAX TIP (DISPOSABLE) ×2
KIT BASIN OR (CUSTOM PROCEDURE TRAY) ×3 IMPLANT
MANIFOLD NEPTUNE II (INSTRUMENTS) ×3 IMPLANT
NEEDLE SPNL 18GX3.5 QUINCKE PK (NEEDLE) ×3 IMPLANT
PACK TOTAL JOINT (CUSTOM PROCEDURE TRAY) ×3 IMPLANT
PADDING CAST COTTON 6X4 STRL (CAST SUPPLIES) ×3 IMPLANT
PRESSURIZER FEMORAL UNIV (MISCELLANEOUS) ×3 IMPLANT
SAW OSC TIP CART 19.5X105X1.3 (SAW) ×3 IMPLANT
SEALER BIPOLAR AQUA 6.0 (INSTRUMENTS) ×3 IMPLANT
SET HNDPC FAN SPRY TIP SCT (DISPOSABLE) ×1 IMPLANT
SET PAD KNEE POSITIONER (MISCELLANEOUS) ×3 IMPLANT
STEM SPLINED KNEE 14X80MM (Joint) ×3 IMPLANT
STEM SPLINED KNEE 16MMX80MM (Joint) ×3 IMPLANT
SUCTION FRAZIER HANDLE 10FR (MISCELLANEOUS) ×2
SUCTION TUBE FRAZIER 10FR DISP (MISCELLANEOUS) ×1 IMPLANT
SUT MNCRL AB 3-0 PS2 27 (SUTURE) ×3 IMPLANT
SUT MON AB 2-0 CT1 36 (SUTURE) ×6 IMPLANT
SUT VIC AB 1 CTX 27 (SUTURE) ×3 IMPLANT
SUT VIC AB 2-0 CT1 27 (SUTURE) ×2
SUT VIC AB 2-0 CT1 TAPERPNT 27 (SUTURE) ×1 IMPLANT
SUT VLOC 180 0 24IN GS25 (SUTURE) ×3 IMPLANT
SYR 50ML LL SCALE MARK (SYRINGE) ×6 IMPLANT
TIBIAL AUG 51X34 25MM CONE RT (Knees) ×3 IMPLANT
TIBIAL TRAY W/TI LOCK BAR SCRE (Knees) ×3 IMPLANT
TOWEL OR 17X26 10 PK STRL BLUE (TOWEL DISPOSABLE) ×3 IMPLANT
TOWEL OR NON WOVEN STRL DISP B (DISPOSABLE) IMPLANT
TOWER CARTRIDGE SMART MIX (DISPOSABLE) ×3 IMPLANT
TRAY CATH 16FR W/PLASTIC CATH (SET/KITS/TRAYS/PACK) ×3 IMPLANT
TRAY TIBIAL W/TI LOCK BAR SCRE (Knees) ×1 IMPLANT
WRAP KNEE MAXI GEL POST OP (GAUZE/BANDAGES/DRESSINGS) ×3 IMPLANT

## 2017-01-29 NOTE — Anesthesia Postprocedure Evaluation (Signed)
Anesthesia Post Note  Patient: Laura Trujillo  Procedure(s) Performed: Procedure(s) (LRB): TOTAL KNEE REVISION (Right)     Patient location during evaluation: PACU Anesthesia Type: Spinal Level of consciousness: awake and alert Pain management: pain level controlled Vital Signs Assessment: post-procedure vital signs reviewed and stable Respiratory status: spontaneous breathing, nonlabored ventilation, respiratory function stable and patient connected to nasal cannula oxygen Cardiovascular status: stable and blood pressure returned to baseline Anesthetic complications: no    Last Vitals:  Vitals:   01/29/17 2000 01/29/17 2010  BP:  (!) 111/51  Pulse: 85 83  Resp: 14 14  Temp:  36.6 C  SpO2: 97% 96%    Last Pain:  Vitals:   01/29/17 2010  TempSrc: Oral  PainSc:                  Lowella Curb

## 2017-01-29 NOTE — Anesthesia Procedure Notes (Signed)
Spinal  Start time: 01/29/2017 1:35 PM End time: 01/29/2017 1:40 PM Staffing Anesthesiologist: Anitra Lauth RAY Performed: anesthesiologist  Preanesthetic Checklist Completed: patient identified, site marked, surgical consent, pre-op evaluation, timeout performed, IV checked, risks and benefits discussed and monitors and equipment checked Spinal Block Patient position: sitting Prep: Betadine Patient monitoring: heart rate, cardiac monitor, continuous pulse ox and blood pressure Approach: midline Location: L3-4 Injection technique: single-shot Needle Needle type: Quincke  Needle gauge: 22 G Needle length: 9 cm

## 2017-01-29 NOTE — Transfer of Care (Signed)
Immediate Anesthesia Transfer of Care Note  Patient: Laura Trujillo  Procedure(s) Performed: Procedure(s): TOTAL KNEE REVISION (Right)  Patient Location: PACU  Anesthesia Type:Spinal  Level of Consciousness: awake, alert  and oriented  Airway & Oxygen Therapy: Patient Spontanous Breathing and Patient connected to nasal cannula oxygen  Post-op Assessment: Report given to RN and Post -op Vital signs reviewed and stable  Post vital signs: Reviewed and stable  Last Vitals:  Vitals:   01/29/17 1320 01/29/17 1800  BP: (!) 106/51 (!) (P) 117/56  Pulse: 76 85  Resp: 19 18  Temp:  (!) (P) 36.3 C  SpO2: 97% 94%    Last Pain:  Vitals:   01/29/17 1133  TempSrc:   PainSc: 2       Patients Stated Pain Goal: 6 (01/29/17 1133)  Complications: No apparent anesthesia complications

## 2017-01-29 NOTE — Interval H&P Note (Signed)
History and Physical Interval Note:  01/29/2017 1:24 PM  Laura Trujillo  has presented today for surgery, with the diagnosis of Failed right total knee arthroplasty  The various methods of treatment have been discussed with the patient and family. After consideration of risks, benefits and other options for treatment, the patient has consented to  Procedure(s): TOTAL KNEE REVISION (Right) as a surgical intervention .  The patient's history has been reviewed, patient examined, no change in status, stable for surgery.  I have reviewed the patient's chart and labs.  Questions were answered to the patient's satisfaction.     [name], Cloyde Reams

## 2017-01-29 NOTE — Brief Op Note (Signed)
01/29/2017  5:12 PM  PATIENT:  Isabelle Course  61 y.o. female  PRE-OPERATIVE DIAGNOSIS:  Failed right total knee arthroplasty  POST-OPERATIVE DIAGNOSIS:  Failed right total knee arthroplasty  PROCEDURE:  Procedure(s): TOTAL KNEE REVISION (Right)  SURGEON:  Surgeon(s) and Role:    * Kattleya Kuhnert, Arlys John, MD - Primary  PHYSICIAN ASSISTANT:   ASSISTANTS: justin queen, rnfa   ANESTHESIA:   regional and spinal  EBL:  Total I/O In: 1000 [I.V.:1000] Out: 250 [Blood:250]  BLOOD ADMINISTERED:none  DRAINS: medium HV, Prevena  LOCAL MEDICATIONS USED:  NONE  SPECIMEN:  No Specimen  DISPOSITION OF SPECIMEN:  N/A  COUNTS:  YES  TOURNIQUET:  * Missing tourniquet times found for documented tourniquets in log:  [id number] * Total Tourniquet Time Documented: Thigh (Right) - 121 minutes Total: Thigh (Right) - 121 minutes   DICTATION: .Other Dictation: Dictation Number [id number]  PLAN OF CARE: Admit to inpatient   PATIENT DISPOSITION:  PACU - hemodynamically stable.   Delay start of Pharmacological VTE agent (>24hrs) due to surgical blood loss or risk of bleeding: no

## 2017-01-29 NOTE — Anesthesia Procedure Notes (Signed)
Anesthesia Regional Block: Adductor canal block   Pre-Anesthetic Checklist: ,, timeout performed, Correct Patient, Correct Site, Correct Laterality, Correct Procedure, Correct Position, site marked, Risks and benefits discussed,  Surgical consent,  Pre-op evaluation,  At surgeon's request and post-op pain management  Laterality: Right  Prep: chloraprep       Needles:  Injection technique: Single-shot  Needle Type: Stimiplex     Needle Length: 9cm  Needle Gauge: 21     Additional Needles:   Procedures: ultrasound guided,,,,,,,,  Narrative:  Start time: 01/29/2017 1:01 PM End time: 01/29/2017 1:06 PM Injection made incrementally with aspirations every 5 mL.  Performed by: Personally  Anesthesiologist: Anitra Lauth RAY

## 2017-01-29 NOTE — Anesthesia Procedure Notes (Signed)
Procedure Name: MAC Date/Time: 01/29/2017 2:17 PM Performed by: Teressa Lower Pre-anesthesia Checklist: Emergency Drugs available, Patient identified, Suction available, Patient being monitored and Timeout performed Oxygen Delivery Method: Simple face mask Induction Type: IV induction

## 2017-01-29 NOTE — Anesthesia Preprocedure Evaluation (Signed)
Anesthesia Evaluation  Patient identified by MRN, date of birth, ID band Patient awake    Reviewed: Allergy & Precautions, NPO status , Patient's Chart, lab work & pertinent test results  Airway Mallampati: II  TM Distance: >3 FB Neck ROM: Full    Dental no notable dental hx.    Pulmonary neg pulmonary ROS, sleep apnea , former smoker,    Pulmonary exam normal breath sounds clear to auscultation       Cardiovascular hypertension, Pt. on medications negative cardio ROS Normal cardiovascular exam Rhythm:Regular Rate:Normal     Neuro/Psych negative neurological ROS  negative psych ROS   GI/Hepatic negative GI ROS, Neg liver ROS,   Endo/Other  negative endocrine ROSdiabetes, Type 2, Oral Hypoglycemic Agents  Renal/GU negative Renal ROS  negative genitourinary   Musculoskeletal negative musculoskeletal ROS (+) Arthritis ,   Abdominal   Peds negative pediatric ROS (+)  Hematology negative hematology ROS (+)   Anesthesia Other Findings   Reproductive/Obstetrics negative OB ROS                             Anesthesia Physical Anesthesia Plan  ASA: III  Anesthesia Plan: Spinal   Post-op Pain Management: GA combined w/ Regional for post-op pain   Induction: Intravenous  PONV Risk Score and Plan: 2 and Ondansetron and Midazolam  Airway Management Planned: Simple Face Mask  Additional Equipment:   Intra-op Plan:   Post-operative Plan:   Informed Consent: I have reviewed the patients History and Physical, chart, labs and discussed the procedure including the risks, benefits and alternatives for the proposed anesthesia with the patient or authorized representative who has indicated his/her understanding and acceptance.   Dental advisory given  Plan Discussed with: CRNA  Anesthesia Plan Comments:         Anesthesia Quick Evaluation

## 2017-01-29 NOTE — H&P (View-Only) (Signed)
TOTAL KNEE REVISION ADMISSION H&P  Patient is being admitted for right revision total knee arthroplasty.  Subjective:  Chief Complaint:right knee pain.  HPI: Laura Trujillo, 61 y.o. female, has a history of pain and functional disability in the right knee(s) due to failed previous arthroplasty and patient has failed non-surgical conservative treatments for greater than 12 weeks to include NSAID's and/or analgesics, flexibility and strengthening excercises, use of assistive devices and activity modification. The indications for the revision of the total knee arthroplasty are loosening of one or more components. Onset of symptoms was gradual starting 2 years ago with rapidlly worsening course since that time.  Prior procedures on the right knee(s) include arthroplasty.  Patient currently rates pain in the right knee(s) at 10 out of 10 with activity. There is night pain, worsening of pain with activity and weight bearing, pain that interferes with activities of daily living, pain with passive range of motion and joint swelling.  Patient has evidence of prosthetic loosening by imaging studies. This condition presents safety issues increasing the risk of falls.  There is no current active infection.  There are no active problems to display for this patient.  No past medical history on file.  No past surgical history on file.   (Not in a hospital admission) Allergies not on file  Social History  Substance Use Topics  . Smoking status: Not on file  . Smokeless tobacco: Not on file  . Alcohol use Not on file    No family history on file.    Review of Systems  Constitutional: Negative.   HENT: Positive for hearing loss.   Eyes: Negative.   Respiratory: Negative.   Cardiovascular: Negative.   Gastrointestinal: Negative.   Genitourinary: Negative.   Musculoskeletal: Positive for myalgias.  Skin: Positive for rash.  Neurological: Negative.   Endo/Heme/Allergies: Negative.     Psychiatric/Behavioral: Negative.      Objective:  Physical Exam  Vitals reviewed. Constitutional: She is oriented to person, place, and time. She appears well-developed and well-nourished.  HENT:  Head: Normocephalic and atraumatic.  Eyes: Pupils are equal, round, and reactive to light. Conjunctivae and EOM are normal.  Neck: Normal range of motion. Neck supple.  Cardiovascular: Normal rate, regular rhythm and intact distal pulses.   Respiratory: Effort normal. No respiratory distress.  GI: Soft. She exhibits no distension.  Genitourinary:  Genitourinary Comments: deferred  Musculoskeletal:       Right knee: She exhibits decreased range of motion and swelling. Tenderness found. Medial joint line and lateral joint line tenderness noted.       Legs: Neurological: She is alert and oriented to person, place, and time. She has normal reflexes.  Skin: Skin is warm and dry.  Psychiatric: She has a normal mood and affect. Her behavior is normal. Judgment and thought content normal.    Vital signs in last 24 hours: @VSRANGES @  Labs:  There is no height or weight on file to calculate BMI.  Imaging Review Plain radiographs demonstrate cemented replacement of the right knee(s). The overall alignment is neutral.There is evidence of loosening of the tibial components. The bone quality appears to be poor for age and reported activity level.   Assessment/Plan:  End stage arthritis, right knee(s) with failed previous arthroplasty.   The patient history, physical examination, clinical judgment of the provider and imaging studies are consistent with end stage degenerative joint disease of the right knee(s), previous total knee arthroplasty. Revision total knee arthroplasty is deemed medically necessary. The  treatment options including medical management, injection therapy, arthroscopy and revision arthroplasty were discussed at length. The risks and benefits of revision total knee arthroplasty  were presented and reviewed. The risks due to aseptic loosening, infection, stiffness, patella tracking problems, thromboembolic complications and other imponderables were discussed. The patient acknowledged the explanation, agreed to proceed with the plan and consent was signed. Patient is being admitted for inpatient treatment for surgery, pain control, PT, OT, prophylactic antibiotics, VTE prophylaxis, progressive ambulation and ADL's and discharge planning.The patient is planning to be discharged home with home health services  

## 2017-01-30 LAB — CBC
HCT: 40 % (ref 36.0–46.0)
Hemoglobin: 12.9 g/dL (ref 12.0–15.0)
MCH: 28 pg (ref 26.0–34.0)
MCHC: 32.3 g/dL (ref 30.0–36.0)
MCV: 86.8 fL (ref 78.0–100.0)
PLATELETS: 218 10*3/uL (ref 150–400)
RBC: 4.61 MIL/uL (ref 3.87–5.11)
RDW: 14.7 % (ref 11.5–15.5)
WBC: 11.8 10*3/uL — AB (ref 4.0–10.5)

## 2017-01-30 LAB — BASIC METABOLIC PANEL
ANION GAP: 8 (ref 5–15)
BUN: 14 mg/dL (ref 6–20)
CALCIUM: 8.9 mg/dL (ref 8.9–10.3)
CO2: 25 mmol/L (ref 22–32)
Chloride: 104 mmol/L (ref 101–111)
Creatinine, Ser: 0.72 mg/dL (ref 0.44–1.00)
GFR calc Af Amer: >60 mL/min (ref >60)
GLUCOSE: 162 mg/dL — AB (ref 65–99)
POTASSIUM: 4.4 mmol/L (ref 3.5–5.1)
SODIUM: 137 mmol/L (ref 135–145)

## 2017-01-30 LAB — GLUCOSE, CAPILLARY
GLUCOSE-CAPILLARY: 125 mg/dL — AB (ref 65–99)
GLUCOSE-CAPILLARY: 111 mg/dL — AB (ref 65–99)
GLUCOSE-CAPILLARY: 215 mg/dL — AB (ref 65–99)
GLUCOSE-CAPILLARY: 91 mg/dL (ref 65–99)

## 2017-01-30 MED ORDER — APIXABAN 2.5 MG PO TABS: 2.5000 mg | ORAL_TABLET | Freq: Two times a day (BID) | ORAL | 0 refills | Status: DC

## 2017-01-30 MED ORDER — SENNA 8.6 MG PO TABS: 2.0000 | ORAL_TABLET | Freq: Every day | ORAL | 0 refills | Status: DC

## 2017-01-30 MED ORDER — ONDANSETRON HCL 4 MG PO TABS: 4.0000 mg | ORAL_TABLET | Freq: Four times a day (QID) | ORAL | 0 refills | Status: DC | PRN

## 2017-01-30 MED ORDER — HYDROCODONE-ACETAMINOPHEN 5-325 MG PO TABS: 1.0000 | ORAL_TABLET | ORAL | 0 refills | Status: DC | PRN

## 2017-01-30 MED ORDER — DOCUSATE SODIUM 100 MG PO CAPS: 100.0000 mg | ORAL_CAPSULE | Freq: Two times a day (BID) | ORAL | 1 refills | Status: DC

## 2017-01-30 NOTE — Evaluation (Addendum)
Physical Therapy Evaluation Patient Details Name: Laura Trujillo MRN: [id number] DOB: 1955-08-14 Today's Date: 01/30/2017   History of Present Illness  Pt is a 61 y.o. female now s/p R TKA revision on 01/29/17. Pertinent PMH includes sleep apnea, psoriatic arthritis, HTN, heart murmer.  Clinical Impression  Pt presents to PT with increased pain, decreased strength/ROM, and an overall decrease in functional mobility secondary to above. PTA, pt indep and lives at home with husband. Pt able to transfer and amb with RW and min guard for balance; further mobility limited by pain and nausea. Pt motivated to participate with therapy. Educ on precautions, proper positioning, therex, and importance of mobility. Would benefit from continued acute PT services to maximize functional mobility and independence prior to d/c home with HHPT services.     Follow Up Recommendations Home health PT;DC plan and follow up therapy as arranged by surgeon    Equipment Recommendations  Rolling walker with 5" wheels;3in1 (PT)    Recommendations for Other Services OT consult     Precautions / Restrictions Precautions Precautions: Knee Precaution Comments: RLE wound vac and drain Restrictions Weight Bearing Restrictions: Yes RLE Weight Bearing: Weight bearing as tolerated      Mobility  Bed Mobility Overal bed mobility: Needs Assistance Bed Mobility: Supine to Sit     Supine to sit: Min assist;HOB elevated     General bed mobility comments: MinA to assist RLE to EOB; reliant on hand rails to pull towards EOB  Transfers Overall transfer level: Needs assistance Equipment used: Rolling walker (2 wheeled) Transfers: Sit to/from Stand Sit to Stand: Min guard         General transfer comment: Min guard for balance and cues for technique  Ambulation/Gait Ambulation/Gait assistance: Min guard Ambulation Distance (Feet): 40 Feet Assistive device: Rolling walker (2 wheeled) Gait Pattern/deviations:  Step-through pattern;Decreased stride length;Antalgic;Decreased weight shift to right Gait velocity: Decreased Gait velocity interpretation: <1.8 ft/sec, indicative of risk for recurrent falls General Gait Details: Amb with RW and min guard for balance; cues for step-through and technique with RW. Further amb limited secondary to increased pain and nausea. Pt reports numbness in bilat hands with RW use secondary to psoriatic arthritis; encouraged to take standing rest breaks as needed to reduce this.   Stairs            Wheelchair Mobility    Modified Rankin (Stroke Patients Only)       Balance Overall balance assessment: Needs assistance Sitting-balance support: No upper extremity supported;Feet supported Sitting balance-Leahy Scale: Good     Standing balance support: Bilateral upper extremity supported;During functional activity Standing balance-Leahy Scale: Poor Standing balance comment: Reliant on BUE support                              Pertinent Vitals/Pain Pain Assessment: 0-10 Pain Score: 4  Pain Location: R knee at rest Pain Descriptors / Indicators: Aching;Burning Pain Intervention(s): Limited activity within patient's tolerance;Monitored during session;Repositioned    Home Living Family/patient expects to be discharged to:: Private residence Living Arrangements: Spouse/significant other Available Help at Discharge: Family;Available 24 hours/day Type of Home: House Home Access: Stairs to enter Entrance Stairs-Rails: Left Entrance Stairs-Number of Steps: 3 Home Layout: One level Home Equipment: Shower seat      Prior Function Level of Independence: Independent               Hand Dominance  Extremity/Trunk Assessment   Upper Extremity Assessment Upper Extremity Assessment: Generalized weakness;RUE deficits/detail;LUE deficits/detail RUE Deficits / Details: Pt reports psoriatic arthritis causes numbness in hands when using  RW LUE Deficits / Details: Pt reports psoriatic arthritis causes numbness in hands when using RW    Lower Extremity Assessment Lower Extremity Assessment: RLE deficits/detail RLE Deficits / Details: R hip flexion grossly 3/5       Communication   Communication: No difficulties  Cognition Arousal/Alertness: Awake/alert Behavior During Therapy: WFL for tasks assessed/performed Overall Cognitive Status: Within Functional Limits for tasks assessed                                        General Comments      Exercises Total Joint Exercises Ankle Circles/Pumps: AROM;Both;10 reps;Supine Quad Sets: Strengthening;Right;5 reps;Supine Heel Slides: AAROM;Right;5 reps   Assessment/Plan    PT Assessment Patient needs continued PT services  PT Problem List Decreased strength;Decreased range of motion;Decreased knowledge of use of DME;Decreased activity tolerance;Decreased balance;Decreased knowledge of precautions;Decreased mobility;Pain       PT Treatment Interventions DME instruction;Gait training;Stair training;Functional mobility training;Therapeutic activities;Therapeutic exercise;Balance training;Patient/family education    PT Goals (Current goals can be found in the Care Plan section)  Acute Rehab PT Goals Patient Stated Goal: Return home PT Goal Formulation: With patient Time For Goal Achievement: 02/13/17 Potential to Achieve Goals: Good    Frequency 7X/week   Barriers to discharge        Co-evaluation               AM-PAC PT "6 Clicks" Daily Activity  Outcome Measure Difficulty turning over in bed (including adjusting bedclothes, sheets and blankets)?: A Little Difficulty moving from lying on back to sitting on the side of the bed? : A Little Difficulty sitting down on and standing up from a chair with arms (e.g., wheelchair, bedside commode, etc,.)?: A Little Help needed moving to and from a bed to chair (including a wheelchair)?: A  Little Help needed walking in hospital room?: A Little Help needed climbing 3-5 steps with a railing? : A Little 6 Click Score: 18    End of Session Equipment Utilized During Treatment: Gait belt Activity Tolerance: Patient tolerated treatment well;Patient limited by pain Patient left: in chair;with call bell/phone within reach Nurse Communication: Mobility status PT Visit Diagnosis: Other abnormalities of gait and mobility (R26.89);Pain Pain - Right/Left: Right Pain - part of body: Knee    Time: 8891-6945 PT Time Calculation (min) (ACUTE ONLY): 30 min   Charges:   PT Evaluation $PT Eval Moderate Complexity: 1 Mod PT Treatments $Therapeutic Activity: 8-22 mins   PT G Codes:       Ina Homes, PT, DPT [phone] Acute Rehab  Malachy Chamber 01/30/2017, 9:18 AM

## 2017-01-30 NOTE — Care Management Note (Signed)
Case Management Note  Patient Details  Name: Laura Trujillo MRN: [id number] Date of Birth: [birth year]  Subjective/Objective:  61 yr old female s/p right total knee revision.                   Action/Plan:  Case manager spoke with patient concerning discharge plan and DME needs. Patient was preoperatively setup with [facility], no changes. Will have family support at discharge. Case manager has requested DME.    Expected Discharge Date:   01/31/17               Expected Discharge Plan:  Home w Home Health Services  In-House Referral:  NA  Discharge planning Services  CM Consult  Post Acute Care Choice:  Home Health, Durable Medical Equipment Choice offered to:  Patient  DME Arranged:  3-N-1, Walker rolling DME Agency:  [facility]  HH Arranged:  PT HH Agency:  [facility] ([facility]  Status of Service:  Completed, signed off  If discussed at [employer], dates discussed:    Additional Comments:  Jasneet, Mckiernan, RN 01/30/2017, 12:37 PM

## 2017-01-30 NOTE — Progress Notes (Signed)
Physical Therapy Treatment Patient Details Name: Laura Trujillo MRN: [id number] DOB: 08-27-1955 Today's Date: 01/30/2017    History of Present Illness Pt is a 61 y.o. female now s/p R TKA revision on 01/29/17. Pertinent PMH includes sleep apnea, psoriatic arthritis, HTN, heart murmer.   PT Comments    Pt progressing well with mobility. Able to change clothes, brush teeth, and amb with RW and min guard for balance. Remains motivated to participate with PT. Plan for stair training next visit. Will continue to follow acutely.    Follow Up Recommendations  Home health PT;DC plan and follow up therapy as arranged by surgeon     Equipment Recommendations  Rolling walker with 5" wheels;3in1 (PT)    Recommendations for Other Services       Precautions / Restrictions Precautions Precautions: Knee Precaution Comments: RLE wound vac and drain Restrictions Weight Bearing Restrictions: Yes RLE Weight Bearing: Weight bearing as tolerated    Mobility  Bed Mobility Overal bed mobility: Needs Assistance Bed Mobility: Sit to Supine       Sit to supine: Supervision   General bed mobility comments: Supervision with cues for technique to assist RLE into bed by hooking LLE underneath it.   Transfers Overall transfer level: Needs assistance Equipment used: Rolling walker (2 wheeled) Transfers: Sit to/from Stand Sit to Stand: Min guard         General transfer comment: Stood x4 from chair, toilet, and bed with min guard for balance and cues for technique  Ambulation/Gait Ambulation/Gait assistance: Min guard Ambulation Distance (Feet): 60 Feet Assistive device: Rolling walker (2 wheeled) Gait Pattern/deviations: Step-through pattern;Antalgic;Decreased weight shift to right;Decreased stride length Gait velocity: Decreased Gait velocity interpretation: <1.8 ft/sec, indicative of risk for recurrent falls General Gait Details: Amb with RW and min guard for balance; 1x standing rest  break secondary to bilat hand numbness pt reports is due to psoriatic arthritis. 1x episode self-corrected LOB secondary to R knee instability.   Stairs            Wheelchair Mobility    Modified Rankin (Stroke Patients Only)       Balance Overall balance assessment: Needs assistance Sitting-balance support: No upper extremity supported;Feet supported Sitting balance-Leahy Scale: Good Sitting balance - Comments: Indep with pericare on toilet   Standing balance support: Single extremity supported;During functional activity Standing balance-Leahy Scale: Poor Standing balance comment: Reliant on single UE support while standing at sink to brush teeth.                             Cognition Arousal/Alertness: Awake/alert Behavior During Therapy: WFL for tasks assessed/performed Overall Cognitive Status: Within Functional Limits for tasks assessed                                        Exercises Total Joint Exercises Goniometric ROM: R knee flex 75'    General Comments        Pertinent Vitals/Pain Pain Assessment: Faces Pain Score: 4  Faces Pain Scale: Hurts a little bit Pain Location: R knee Pain Descriptors / Indicators: Sore;Discomfort Pain Intervention(s): Limited activity within patient's tolerance;Monitored during session;Premedicated before session    Home Living Family/patient expects to be discharged to:: Private residence Living Arrangements: Spouse/significant other Available Help at Discharge: Family;Available 24 hours/day Type of Home: House       Home  Equipment: Information systems manager - built in      Prior Function Level of Independence: Independent          PT Goals (current goals can now be found in the care plan section) Acute Rehab PT Goals Patient Stated Goal: Return home PT Goal Formulation: With patient Time For Goal Achievement: 02/13/17 Potential to Achieve Goals: Good Progress towards PT goals: Progressing  toward goals    Frequency    7X/week      PT Plan Current plan remains appropriate    Co-evaluation              AM-PAC PT "6 Clicks" Daily Activity  Outcome Measure  Difficulty turning over in bed (including adjusting bedclothes, sheets and blankets)?: None Difficulty moving from lying on back to sitting on the side of the bed? : None Difficulty sitting down on and standing up from a chair with arms (e.g., wheelchair, bedside commode, etc,.)?: A Little Help needed moving to and from a bed to chair (including a wheelchair)?: A Little Help needed walking in hospital room?: A Little Help needed climbing 3-5 steps with a railing? : A Little 6 Click Score: 20    End of Session Equipment Utilized During Treatment: Gait belt Activity Tolerance: Patient tolerated treatment well Patient left: in bed;with call bell/phone within reach Nurse Communication: Mobility status PT Visit Diagnosis: Other abnormalities of gait and mobility (R26.89);Pain Pain - Right/Left: Right Pain - part of body: Knee     Time: 1610-9604 PT Time Calculation (min) (ACUTE ONLY): 31 min  Charges:  $Gait Training: 8-22 mins $Therapeutic Activity: 8-22 mins                    G Codes:      Ina Homes, PT, DPT [phone] Acute Rehab  Malachy Chamber 01/30/2017, 1:34 PM

## 2017-01-30 NOTE — Evaluation (Signed)
Occupational Therapy Evaluation Patient Details Name: Laura Trujillo MRN: [id number] DOB: 05-02-1956 Today's Date: 01/30/2017    History of Present Illness Pt is a 61 y.o. female now s/p R TKA revision on 01/29/17. Pertinent PMH includes sleep apnea, psoriatic arthritis, HTN, heart murmer.   Clinical Impression   PATIENT WAS EDUCATED ON USE OF AE FOR LE ADLS. PNT WAS ABLE TO RETURN DEMO. PNT OFFERED TO HAVE ADDITIONAL TRAINING TOMORROW BUT STATED SHE UNDERSTOOD HOW TO USE EQUIPMENT. PNT IS MIN GUARD ASSIST WITH TRANSFER TO 3-1 COMMODE. PNT DOES NOT WANT FURTHER ACUTE OT AND STATES HUSBAND CAN ASSIST AT CURRENT LEVEL. PNT WOULD BENEFIT FROM HHOT AND IS AN AGREEMENT.     Follow Up Recommendations  Home health OT    Equipment Recommendations  3 in 1 bedside commode    Recommendations for Other Services       Precautions / Restrictions Precautions Precautions: Knee Precaution Comments: RLE wound vac and drain Restrictions Weight Bearing Restrictions: Yes RLE Weight Bearing: Weight bearing as tolerated      Mobility Bed Mobility Overal bed mobility: Needs Assistance Bed Mobility: Supine to Sit     Supine to sit: Min assist;HOB elevated     General bed mobility comments: MinA to assist RLE to EOB; reliant on hand rails to pull towards EOB  Transfers Overall transfer level: Needs assistance Equipment used: Rolling walker (2 wheeled) Transfers: Sit to/from Stand Sit to Stand: Min guard         General transfer comment: Min guard for balance and cues for technique    Balance Overall balance assessment: Needs assistance Sitting-balance support: No upper extremity supported;Feet supported Sitting balance-Leahy Scale: Good     Standing balance support: Bilateral upper extremity supported;During functional activity Standing balance-Leahy Scale: Poor Standing balance comment: Reliant on BUE support                            ADL either performed or assessed  with clinical judgement   ADL Overall ADL's : Needs assistance/impaired Eating/Feeding: Independent   Grooming: Supervision/safety;Standing   Upper Body Bathing: Set up;Sitting   Lower Body Bathing: Minimal assistance;Sit to/from stand   Upper Body Dressing : Set up;Sitting   Lower Body Dressing: Minimal assistance;With adaptive equipment;Sit to/from stand   Toilet Transfer: Minimal assistance;BSC   Toileting- Architect and Hygiene: Supervision/safety;Sit to/from stand       Functional mobility during ADLs: Minimal assistance General ADL Comments: PATIENT EDUCATED ON USE OF AE FOR LE ADLS TO INCREASE I .     Vision Baseline Vision/History: Wears glasses Wears Glasses: At all times       Perception     Praxis      Pertinent Vitals/Pain Pain Assessment: 0-10 Pain Score: 4  Pain Location: R knee at rest Pain Descriptors / Indicators: Aching;Burning Pain Intervention(s): Limited activity within patient's tolerance     Hand Dominance     Extremity/Trunk Assessment Upper Extremity Assessment Upper Extremity Assessment: Generalized weakness RUE Deficits / Details: Pt reports psoriatic arthritis causes numbness in hands when using RW LUE Deficits / Details: Pt reports psoriatic arthritis causes numbness in hands when using RW   Lower Extremity Assessment Lower Extremity Assessment: RLE deficits/detail RLE Deficits / Details: R hip flexion grossly 3/5       Communication Communication Communication: No difficulties   Cognition Arousal/Alertness: Awake/alert Behavior During Therapy: WFL for tasks assessed/performed Overall Cognitive Status: Within Functional Limits for tasks assessed  General Comments       Exercises Total Joint Exercises Ankle Circles/Pumps: AROM;Both;10 reps;Supine Quad Sets: Strengthening;Right;5 reps;Supine Heel Slides: AAROM;Right;5 reps   Shoulder Instructions       Home Living Family/patient expects to be discharged to:: Private residence Living Arrangements: Spouse/significant other Available Help at Discharge: Family;Available 24 hours/day Type of Home: House Home Access: Stairs to enter [employer] of Steps: 3 Entrance Stairs-Rails: Left Home Layout: One level     Bathroom Shower/Tub: Producer, television/film/video: Standard     Home Equipment: Shower seat - built in          Prior Functioning/Environment Level of Independence: Independent                 OT Problem List:        OT Treatment/Interventions:      OT Goals(Current goals can be found in the care plan section) Acute Rehab OT Goals Patient Stated Goal: GO HOME  OT Frequency:     Barriers to D/C:            Co-evaluation              AM-PAC PT "6 Clicks" Daily Activity     Outcome Measure Help from another person eating meals?: None Help from another person taking care of personal grooming?: A Little Help from another person toileting, which includes using toliet, bedpan, or urinal?: A Little Help from another person bathing (including washing, rinsing, drying)?: A Little Help from another person to put on and taking off regular upper body clothing?: A Little Help from another person to put on and taking off regular lower body clothing?: A Little 6 Click Score: 19   End of Session Equipment Utilized During Treatment: Gait belt;Rolling walker  Activity Tolerance: Patient tolerated treatment well Patient left: in chair;with call bell/phone within reach                   Time: 1005-1050 OT Time Calculation (min): 45 min Charges:  OT General Charges $OT Visit: 1 Procedure OT Evaluation $OT Eval Low Complexity: 1 Procedure OT Treatments $Self Care/Home Management : 23-37 mins G-Codes:     6 CLICKS SCORE 19   Theador Jezewski 01/30/2017, 10:55 AM

## 2017-01-30 NOTE — Progress Notes (Signed)
   Subjective:  Patient reports pain as mild to moderate.  Denies N/V/CP/OSB.  Objective:   VITALS:   Vitals:   01/29/17 2000 01/29/17 2010 01/30/17 0016 01/30/17 0619  BP:  (!) 111/51 (!) 107/48 (!) 104/51  Pulse: 85 83 73 63  Resp: 14 14 16 16   Temp:  97.9 F (36.6 C) 97.7 F (36.5 C) 97.8 F (36.6 C)  TempSrc:  Oral Oral Oral  SpO2: 97% 96% 95% 100%  Weight:      Height:        NAD ABD soft Sensation intact distally Intact pulses distally Dorsiflexion/Plantar flexion intact Incision: dressing C/D/I Compartment soft HV ss Prevena intact   Lab Results  Component Value Date   WBC 11.8 (H) 01/30/2017   HGB 12.9 01/30/2017   HCT 40.0 01/30/2017   MCV 86.8 01/30/2017   PLT 218 01/30/2017   BMET    Component Value Date/Time   NA 137 01/30/2017 0334   K 4.4 01/30/2017 0334   CL 104 01/30/2017 0334   CO2 25 01/30/2017 0334   GLUCOSE 162 (H) 01/30/2017 0334   BUN 14 01/30/2017 0334   CREATININE 0.72 01/30/2017 0334   CALCIUM 8.9 01/30/2017 0334   GFRNONAA >60 01/30/2017 0334   GFRAA >60 01/30/2017 0334     Assessment/Plan: 1 Day Post-Op   Principal Problem:   Failed total knee, right, initial encounter (HCC) Active Problems:   Failed total right knee replacement (HCC)   WBAT with walker Apixaban, SCDs, TEDs PO pain control Cont Prevena and HV PT/OT Dispo: D/C home tomorrow vs Thurs with HHPT   Laura Trujillo, Laura Trujillo 01/30/2017, 1:01 PM   Samson Frederic, MD Cell [phone]

## 2017-01-30 NOTE — Op Note (Signed)
NAME:  Laura Trujillo, Laura Trujillo NO.:  MEDICAL RECORD NO.:  [id number]  LOCATION:                                 FACILITY:  PHYSICIAN:  Samson Frederic, MD     DATE OF BIRTH:  1955-12-21  DATE OF PROCEDURE:  01/29/2017 DATE OF DISCHARGE:                              OPERATIVE REPORT   SURGEON:  Samson Frederic, M.D.  ASSISTANT:  Hart Carwin, RNFA.  PREOPERATIVE DIAGNOSIS:  Failed right total knee arthroplasty.  POSTOPERATIVE DIAGNOSIS:  Failed right total knee arthroplasty.  PROCEDURE PERFORMED:  Revision right total knee arthroplasty including entire femoral and tibial components.  EXPLANT:  Stryker Scorpio cruciate retaining knee:  Femoral component, tibial component, and polyethylene liner.  IMPLANTS: 1. Biomet 360 femoral component, size 65 mm right with 2.5 mm offset     adapter and 16 x 80 mm spline stem. 2. Biomet 360 tibial tray size 71 mm with 5 mm medial and lateral     augments, 2.5 mm offset adapter, and 14 x 80 mm stem. 3. Zimmer Trabecular Metal tibial cone size large right 51 x 34 mm. 4. Vanguard PS tibial bearing, size 14 mm.  ANESTHESIA:  Adductor canal block plus spinal.  ESTIMATED BLOOD LOSS:  300 mL.  COMPLICATIONS:  None.  SPECIMENS:  None.  TUBES AND DRAINS: 1. Medium Hemovac x1. 2. Prevena incisional wound VAC.  DISPOSITION:  Stable to PACU.  INDICATIONS:  The patient is a 61 year old female, who underwent remote right total knee arthroplasty.  She has had progressive pain.  X-rays showed loosening of the tibial component.  Workup for infection was negative.  We discussed the risks, benefits, alternatives to total knee revision.  She elected to proceed.  DESCRIPTION OF PROCEDURE:  I identified the patient in the holding area using 2 identifiers.  The surgical site was marked by myself.  Adductor canal block was placed by Anesthesia.  She was taken to the operating room and spinal anesthesia was obtained.  Foley was  placed.  She was positioned on the operating room table.  Right lower extremity was prepped and draped in normal sterile surgical fashion after a nonsterile tourniquet was applied to the thigh.  A time-out was called verifying side and site of the surgery.  I began by using gravity to exsanguinate the lower extremity.  I elevated the tourniquet to 350 mmHg.  I used a #10 blade to sharply excise her previous anterior midline incision. Full-thickness skin flaps were created.  I made a standard medial parapatellar arthrotomy.  She did have normal appearing clear straw- colored synovial fluid.  I brought the knee into full extension.  I then performed a radical synovectomy of the medial gutter, lateral gutter, and suprapatellar pouch.  I sharply excised the infrapatellar scar.  I then flexed up the knee.  I used an osteotome to remove the polyethylene liner.  The tibia was grossly loose and was easily removed.  I then used an ACL blade to disrupt the interface between the femoral component and the cement.  The femoral component was then removed with no significant bone loss.  I then examined the proximal tibia.  She had a large defect in the proximal tibia.  There was a thin rim of cortical bone around the perimeter.  I did plan for a Zimmer Trabecular Metal.  I removed all the previous cement.  I then sequentially reamed up to a 14 mm reamer.  I freshened the proximal tibia cut.  I sized the tibia, selected the offset in rotation.  I then prepared the proximal tibia.  I used a freehand bur technique to remove a minimal amount of bone posteriorly in order to accommodate the cone.  The real tibial component was assembled and then inserted into the bone.  I then turned my attention to the femur.  I sequentially reamed up to a 16.  I then freshened the cuts.  I then pinned the trial, cut the box.  I assembled the trial femoral component.  The knee was then reduced.  She had excellent range  of motion, 5 mm augments were appropriate.  Flexion-extension balance was perfect.  I then removed all the trial components.  Tourniquet was let down.  The real components were assembled on the back table.  The tourniquet was reinflated after 20 minutes and then I impacted the tibial cone into the proximal tibia.  I then used a hybrid fixation technique to cement in the tibia.  This was held until the cement totally dried.  Separate batch was mixed and the femoral component was cemented as well.  A trial liner was placed.  Knee was brought into extension while the cement polymerized.  Excess cement was cleared.  I then placed the real polyethylene liner followed by the locking pin. Final motion was excellent.  Varus and valgus stability were perfect throughout the range of motion.  Flexion and extension balance were perfect.  I then examined the patellar component.  There was no loosening.  She had some mild wear laterally.  I elected to retain the components.  The tourniquet was let down.  The knee was copiously irrigated.  I closed the arthrotomy over a medium Hemovac drain with #1 Vicryl and 0 V-Loc.  The deep fatty layer was closed with 2-0 running Vicryl.  Deep dermal layer was closed with 2-0 interrupted Monocryl. Skin was closed with 2-0 mattress sutures.  Prevena incisional VAC was applied according to manufacture's instructions.  Compression Ace wrap was then applied.  The patient was then aroused from anesthesia and taken to PACU in stable condition.  Sponge, needle, and instrument counts were correct at the end of the case x2.  There were no known complications.          ______________________________ Samson Frederic, MD     BS/MEDQ  D:  01/29/2017  T:  01/29/2017  Job:  [id number]

## 2017-01-30 NOTE — Discharge Instructions (Signed)
°Dr. Brian Swinteck °Total Joint Specialist °[facility] °[address] °[place], [place] [address] °[phone] ° °TOTAL KNEE REPLACEMENT POSTOPERATIVE DIRECTIONS ° ° ° °Knee Rehabilitation, Guidelines Following Surgery  °Results after knee surgery are often greatly improved when you follow the exercise, range of motion and muscle strengthening exercises prescribed by your doctor. Safety measures are also important to protect the knee from further injury. Any time any of these exercises cause you to have increased pain or swelling in your knee joint, decrease the amount until you are comfortable again and slowly increase them. If you have problems or questions, call your caregiver or physical therapist for advice.  ° °WEIGHT BEARING °Weight bearing as tolerated with assist device (walker, cane, etc) as directed, use it as long as suggested by your surgeon or therapist, typically at least 4-6 weeks. ° °HOME CARE INSTRUCTIONS  °Remove items at home which could result in a fall. This includes throw rugs or furniture in walking pathways.  °Continue medications as instructed at time of discharge. °You may have some home medications which will be placed on hold until you complete the course of blood thinner medication.  °You may start showering once you are discharged home but do not submerge the incision under water. Just pat the incision dry and apply a dry gauze dressing on daily. °Walk with walker as instructed.  °You may resume a sexual relationship in one month or when given the OK by your doctor.  °· Use walker as long as suggested by your caregivers. °· Avoid periods of inactivity such as sitting longer than an hour when not asleep. This helps prevent blood clots.  °You may put full weight on your legs and walk as much as is comfortable.  °You may return to work once you are cleared by your doctor.  °Do not drive a car for 6 weeks or until released by you surgeon.  °· Do not drive while  taking narcotics.  °Wear the elastic stockings for three weeks following surgery during the day but you may remove then at night. °Make sure you keep all of your appointments after your operation with all of your doctors and caregivers. You should call the office at the above phone number and make an appointment for approximately two weeks after the date of your surgery. °Do not remove your surgical dressing. The dressing is waterproof; you may take showers in 3 days, but do not take tub baths or submerge the dressing. °Please pick up a stool softener and laxative for home use as long as you are requiring pain medications. °· ICE to the affected knee every three hours for 30 minutes at a time and then as needed for pain and swelling.  Continue to use ice on the knee for pain and swelling from surgery. You may notice swelling that will progress down to the foot and ankle.  This is normal after surgery.  Elevate the leg when you are not up walking on it.   °It is important for you to complete the blood thinner medication as prescribed by your doctor. °· Continue to use the breathing machine which will help keep your temperature down.  It is common for your temperature to cycle up and down following surgery, especially at night when you are not up moving around and exerting yourself.  The breathing machine keeps your lungs expanded and your temperature down. ° °RANGE OF MOTION AND STRENGTHENING EXERCISES  °Rehabilitation of the knee is important following a   knee injury or an operation. After just a few days of immobilization, the muscles of the thigh which control the knee become weakened and shrink (atrophy). Knee exercises are designed to build up the tone and strength of the thigh muscles and to improve knee motion. Often times heat used for twenty to thirty minutes before working out will loosen up your tissues and help with improving the range of motion but do not use heat for the first two weeks following  surgery. These exercises can be done on a training (exercise) mat, on the floor, on a table or on a bed. Use what ever works the best and is most comfortable for you Knee exercises include:  Leg Lifts - While your knee is still immobilized in a splint or cast, you can do straight leg raises. Lift the leg to 60 degrees, hold for 3 sec, and slowly lower the leg. Repeat 10-20 times 2-3 times daily. Perform this exercise against resistance later as your knee gets better.  Quad and Hamstring Sets - Tighten up the muscle on the front of the thigh (Quad) and hold for 5-10 sec. Repeat this 10-20 times hourly. Hamstring sets are done by pushing the foot backward against an object and holding for 5-10 sec. Repeat as with quad sets.  A rehabilitation program following serious knee injuries can speed recovery and prevent re-injury in the future due to weakened muscles. Contact your doctor or a physical therapist for more information on knee rehabilitation.   SKILLED REHAB INSTRUCTIONS: If the patient is transferred to a skilled rehab facility following release from the hospital, a list of the current medications will be sent to the facility for the patient to continue.  When discharged from the skilled rehab facility, please have the facility set up the patient's Home Health Physical Therapy prior to being released. Also, the skilled facility will be responsible for providing the patient with their medications at time of release from the facility to include their pain medication, the muscle relaxants, and their blood thinner medication. If the patient is still at the rehab facility at time of the two week follow up appointment, the skilled rehab facility will also need to assist the patient in arranging follow up appointment in our office and any transportation needs.  MAKE SURE YOU:  Understand these instructions.  Will watch your condition.  Will get help right away if you are not doing well or get worse.     Pick up stool softner and laxative for home use following surgery while on pain medications. Do NOT remove your dressing.  Keep VAC clean and dry. Make sure to charge the [facility] unit nightly. Please use a clean towel to pat the incision dry following showers. Continue to use ice for pain and swelling after surgery. Do not use any lotions or creams on the incision until instructed by your surgeon.   Information on my medicine - ELIQUIS (apixaban)  This medication education was reviewed with me or my healthcare representative as part of my discharge preparation.  The pharmacist that spoke with me during my hospital stay was:  Renaee Munda, [facility]  Why was Eliquis prescribed for you? Eliquis was prescribed for you to reduce the risk of blood clots forming after orthopedic surgery.    What do You need to know about Eliquis? Take your Eliquis TWICE DAILY - one tablet in the morning and one tablet in the evening with or without food.  It would be best to take  the dose about the same time each day.  If you have difficulty swallowing the tablet whole please discuss with your pharmacist how to take the medication safely.  Take Eliquis exactly as prescribed by your doctor and DO NOT stop taking Eliquis without talking to the doctor who prescribed the medication.  Stopping without other medication to take the place of Eliquis may increase your risk of developing a clot.  After discharge, you should have regular check-up appointments with your healthcare provider that is prescribing your Eliquis.  What do you do if you miss a dose? If a dose of ELIQUIS is not taken at the scheduled time, take it as soon as possible on the same day and twice-daily administration should be resumed.  The dose should not be doubled to make up for a missed dose.  Do not take more than one tablet of ELIQUIS at the same time.  Important Safety Information A possible side effect of Eliquis is bleeding. You  should call your healthcare provider right away if you experience any of the following: ? Bleeding from an injury or your nose that does not stop. ? Unusual colored urine (red or dark brown) or unusual colored stools (red or black). ? Unusual bruising for unknown reasons. ? A serious fall or if you hit your head (even if there is no bleeding).  Some medicines may interact with Eliquis and might increase your risk of bleeding or clotting while on Eliquis. To help avoid this, consult your healthcare provider or pharmacist prior to using any new prescription or non-prescription medications, including herbals, vitamins, non-steroidal anti-inflammatory drugs (NSAIDs) and supplements.  This website has more information on Eliquis (apixaban): http://[email or web address].com/eliquis/home

## 2017-01-31 ENCOUNTER — Encounter (HOSPITAL_COMMUNITY): Payer: Self-pay | Admitting: Orthopedic Surgery

## 2017-01-31 LAB — CBC
HEMATOCRIT: 37.3 % (ref 36.0–46.0)
HEMOGLOBIN: 11.8 g/dL — AB (ref 12.0–15.0)
MCH: 27.3 pg (ref 26.0–34.0)
MCHC: 31.6 g/dL (ref 30.0–36.0)
MCV: 86.3 fL (ref 78.0–100.0)
Platelets: 234 10*3/uL (ref 150–400)
RBC: 4.32 MIL/uL (ref 3.87–5.11)
RDW: 14.8 % (ref 11.5–15.5)
WBC: 10.4 10*3/uL (ref 4.0–10.5)

## 2017-01-31 LAB — GLUCOSE, CAPILLARY
GLUCOSE-CAPILLARY: 89 mg/dL (ref 65–99)
Glucose-Capillary: 120 mg/dL — ABNORMAL HIGH (ref 65–99)

## 2017-01-31 NOTE — Progress Notes (Signed)
Physical Therapy Treatment Patient Details Name: Laura Trujillo MRN: [id number] DOB: [birth year] Today's Date: 01/31/2017    History of Present Illness Pt is a 61 y.o. female now s/p R TKA revision on 01/29/17. Pertinent PMH includes sleep apnea, psoriatic arthritis, HTN, heart murmer.    PT Comments    Patient tolerated increased gait distance and stair training this session. Pt able to safely negotiate steps simulating home entrance and husband present throughout session. Pt with nausea but no emesis. Current plan remains appropriate.    Follow Up Recommendations  Home health PT;DC plan and follow up therapy as arranged by surgeon     Equipment Recommendations  Rolling walker with 5" wheels;3in1 (PT)    Recommendations for Other Services OT consult     Precautions / Restrictions Precautions Precautions: Knee Precaution Comments: RLE wound vac and drain Restrictions Weight Bearing Restrictions: No RLE Weight Bearing: Weight bearing as tolerated    Mobility  Bed Mobility Overal bed mobility: Modified Independent             General bed mobility comments: increased time and effort; no use of rails; pt used L LE to bring R LE over EOB  Transfers Overall transfer level: Needs assistance Equipment used: Rolling walker (2 wheeled) Transfers: Sit to/from Stand Sit to Stand: Min guard         General transfer comment: from EOB and recliner; safe hand placement demonstrated  Ambulation/Gait Ambulation/Gait assistance: Min guard Ambulation Distance (Feet): 120 Feet Assistive device: Rolling walker (2 wheeled) Gait Pattern/deviations: Step-through pattern;Antalgic;Decreased weight shift to right;Decreased stride length Gait velocity: Decreased   General Gait Details: min guard for safety; cues for posture and R knee extension during stance phase   Stairs Stairs: Yes   Stair Management: One rail Left;Step to pattern;Forwards;No rails;Backwards;With walker Number  of Stairs:  (2 steps with rail and 1 step with RW ) General stair comments: cues for sequencing and technique; husband present  Wheelchair Mobility    Modified Rankin (Stroke Patients Only)       Balance Overall balance assessment: Needs assistance Sitting-balance support: No upper extremity supported;Feet supported Sitting balance-Leahy Scale: Good     Standing balance support: Single extremity supported;During functional activity Standing balance-Leahy Scale: Poor                              Cognition Arousal/Alertness: Awake/alert Behavior During Therapy: WFL for tasks assessed/performed Overall Cognitive Status: Within Functional Limits for tasks assessed                                        Exercises      General Comments General comments (skin integrity, edema, etc.): HEP reviewed with pt and husband; pt with nausea but no emesis during session; RN to given nausea medication      Pertinent Vitals/Pain Pain Assessment: Faces Faces Pain Scale: Hurts little more Pain Location: R knee Pain Descriptors / Indicators: Sore;Guarding Pain Intervention(s): Monitored during session;Premedicated before session;Repositioned    Home Living                      Prior Function            PT Goals (current goals can now be found in the care plan section) Acute Rehab PT Goals PT Goal Formulation: With patient Time For  Goal Achievement: 02/13/17 Potential to Achieve Goals: Good Progress towards PT goals: Progressing toward goals    Frequency    7X/week      PT Plan Current plan remains appropriate    Co-evaluation              AM-PAC PT "6 Clicks" Daily Activity  Outcome Measure  Difficulty turning over in bed (including adjusting bedclothes, sheets and blankets)?: None Difficulty moving from lying on back to sitting on the side of the bed? : None Difficulty sitting down on and standing up from a chair with arms  (e.g., wheelchair, bedside commode, etc,.)?: A Little Help needed moving to and from a bed to chair (including a wheelchair)?: A Little Help needed walking in hospital room?: A Little Help needed climbing 3-5 steps with a railing? : A Little 6 Click Score: 20    End of Session Equipment Utilized During Treatment: Gait belt Activity Tolerance: Patient tolerated treatment well Patient left: with call bell/phone within reach;in chair;with family/visitor present Nurse Communication: Mobility status PT Visit Diagnosis: Other abnormalities of gait and mobility (R26.89);Pain Pain - Right/Left: Right Pain - part of body: Knee     Time: 5436-0677 PT Time Calculation (min) (ACUTE ONLY): 32 min  Charges:  $Gait Training: 23-37 mins                    G Codes:       Erline Levine, PTA Pager: [phone]     Carolynne Edouard 01/31/2017, 11:29 AM

## 2017-01-31 NOTE — Progress Notes (Signed)
Pt ready for d/c home today per MD. Pt met PT goals, equipment was delivered to room. Discharge instructions and prescriptions reviewed with pt and husband, all question answered. Prevena wound vac was applied by MD.   Laura Trujillo

## 2017-01-31 NOTE — Discharge Summary (Signed)
Physician Discharge Summary  Patient ID: Laura Trujillo MRN: [id number] DOB/AGE: Nov 06, 1955 61 y.o.  Admit date: 01/29/2017 Discharge date: 01/31/2017  Admission Diagnoses:  Failed total knee, right, initial encounter [facility])  Discharge Diagnoses:  Principal Problem:   Failed total knee, right, initial encounter [facility]) Active Problems:   Failed total right knee replacement [facility])   Past Medical History:  Diagnosis Date  . Arthritis   . Hearing loss - on right side    'eardrum is concave"  --  no hearing aids  . Heart murmur   . Hypertension   . Pre-diabetes    dx 2013     . Psoriasis    "SPOTS HERE AND THERE, BUT WORSE IS ON MY ELBOWS"  . Psoriatic arthritis (HCC)   . Seasonal allergies   . Sleep apnea    study just done 10/2016    Surgeries: Procedure(s): TOTAL KNEE REVISION on 01/29/2017   Consultants (if any):   Discharged Condition: Improved  Hospital Course: Laura Trujillo is an 61 y.o. female who was admitted 01/29/2017 with a diagnosis of Failed total knee, right, initial encounter [facility]) and went to the operating room on 01/29/2017 and underwent the above named procedures.    She was given perioperative antibiotics:  Anti-infectives    Start     Dose/Rate Route Frequency Ordered Stop   01/30/17 0000  vancomycin (VANCOCIN) IVPB 1000 mg/200 mL premix     1,000 mg 200 mL/hr over 60 Minutes Intravenous Every 12 hours 01/29/17 2024 01/30/17 0022   01/29/17 1415  clindamycin (CLEOCIN) IVPB 900 mg     900 mg 100 mL/hr over 30 Minutes Intravenous To Surgery 01/29/17 1414 01/29/17 1419   01/29/17 1116  ceFAZolin (ANCEF) IVPB 2g/100 mL premix  Status:  Discontinued     2 g 200 mL/hr over 30 Minutes Intravenous On call to O.R. 01/29/17 1116 01/29/17 1117   01/29/17 1116  vancomycin (VANCOCIN) IVPB 1000 mg/200 mL premix     1,000 mg 200 mL/hr over 60 Minutes Intravenous On call to O.R. 01/29/17 1116 01/29/17 1309    .  She was given sequential compression devices,  early ambulation, and apixaban for DVT prophylaxis.  Prevena VAC home unit was placed.  She benefited maximally from the hospital stay and there were no complications.    Recent vital signs:  Vitals:   01/30/17 2025 01/31/17 0623  BP: 126/61 128/63  Pulse: 82 69  Resp: 16 17  Temp: 99.4 F (37.4 C) 97.9 F (36.6 C)  SpO2: 98% 97%    Recent laboratory studies:  Lab Results  Component Value Date   HGB 11.8 (L) 01/31/2017   HGB 12.9 01/30/2017   HGB 14.9 01/25/2017   Lab Results  Component Value Date   WBC 10.4 01/31/2017   PLT 234 01/31/2017   No results found for: INR Lab Results  Component Value Date   NA 137 01/30/2017   K 4.4 01/30/2017   CL 104 01/30/2017   CO2 25 01/30/2017   BUN 14 01/30/2017   CREATININE 0.72 01/30/2017   GLUCOSE 162 (H) 01/30/2017    Discharge Medications:   Allergies as of 01/31/2017      Reactions   Penicillins    Reaction as child, unsure if it is still a true allergy      Medication List    STOP taking these medications   ALEVE 220 MG tablet Generic drug:  naproxen sodium   aspirin EC 81 MG tablet  methotrexate 2.5 MG tablet     TAKE these medications   albuterol 108 (90 Base) MCG/ACT inhaler Commonly known as:  PROVENTIL HFA;VENTOLIN HFA Inhale 1-2 puffs into the lungs every 4 (four) hours as needed for wheezing or shortness of breath.   ALPRAZolam 0.5 MG tablet Commonly known as:  XANAX Take 0.5 mg by mouth 2 (two) times daily as needed for anxiety.   apixaban 2.5 MG Tabs tablet Commonly known as:  ELIQUIS Take 1 tablet (2.5 mg total) by mouth every 12 (twelve) hours.   atorvastatin 10 MG tablet Commonly known as:  LIPITOR Take 5 mg by mouth every morning.   Biotin 5000 MCG Tabs Take 1 tablet by mouth daily.   CALCIUM-MAGNESIUM-ZINC PO Take by mouth.   cyclobenzaprine 10 MG tablet Commonly known as:  FLEXERIL Take 10 mg by mouth 3 (three) times daily as needed for muscle spasms.   docusate sodium 100  MG capsule Commonly known as:  COLACE Take 1 capsule (100 mg total) by mouth 2 (two) times daily.   FLAX SEED OIL PO Take 1,400 mcg by mouth daily.   FOLIC ACID PO Take 1,200 mcg by mouth daily.   glipiZIDE 5 MG 24 hr tablet Commonly known as:  GLUCOTROL XL Take 5 mg by mouth 2 (two) times daily.   HYDROcodone-acetaminophen 5-325 MG tablet Commonly known as:  NORCO/VICODIN Take 1-2 tablets by mouth every 4 (four) hours as needed (breakthrough pain).   lisinopril 10 MG tablet Commonly known as:  PRINIVIL,ZESTRIL Take 5 mg by mouth every morning.   Lutein 10 MG Tabs Take 1 tablet by mouth daily.   Melatonin 3 MG Tabs Take 1.5 mg by mouth at bedtime as needed.   metFORMIN 500 MG tablet Commonly known as:  GLUCOPHAGE Take 500-1,000 mg by mouth See admin instructions. 1000 mg in the morning, and 500 mg every evening   ondansetron 4 MG tablet Commonly known as:  ZOFRAN Take 1 tablet (4 mg total) by mouth every 6 (six) hours as needed for nausea.   senna 8.6 MG Tabs tablet Commonly known as:  SENOKOT Take 2 tablets (17.2 mg total) by mouth at bedtime.   vitamin B-12 1000 MCG tablet Commonly known as:  CYANOCOBALAMIN Take 1,000 mcg by mouth daily.            Durable Medical Equipment        Start     Ordered   01/30/17 1227  For home use only DME Walker rolling  Once    Question:  Patient needs a walker to treat with the following condition  Answer:  S/P total knee arthroplasty, right   01/30/17 1226   01/30/17 1227  For home use only DME 3 n 1  Once     01/30/17 1226       Discharge Care Instructions        Start     Ordered   01/31/17 0000  Call MD / Call 911    Comments:  If you experience chest pain or shortness of breath, CALL 911 and be transported to the hospital emergency room.  If you develope a fever above 101 F, pus (white drainage) or increased drainage or redness at the wound, or calf pain, call your surgeon's office.   01/31/17 1501    01/31/17 0000  Diet - low sodium heart healthy     01/31/17 1501   01/31/17 0000  Constipation Prevention    Comments:  Drink plenty of fluids.  Prune juice may be helpful.  You may use a stool softener, such as Colace (over the counter) 100 mg twice a day.  Use MiraLax (over the counter) for constipation as needed.   01/31/17 1501   01/31/17 0000  Increase activity slowly as tolerated     01/31/17 1501   01/31/17 0000  Driving restrictions    Comments:  No driving for 6 weeks   51/02/58 1501   01/31/17 0000  Lifting restrictions    Comments:  No lifting for 6 weeks   01/31/17 1501   01/31/17 0000  Do not put a pillow under the knee. Place it under the heel.     01/31/17 1501   01/31/17 0000  Discharge instructions    Comments:  Keep VAC dressing clean and dry Charge VAC unit nightly   01/31/17 1501   01/30/17 0000  apixaban (ELIQUIS) 2.5 MG TABS tablet  Every 12 hours     01/30/17 1304   01/30/17 0000  docusate sodium (COLACE) 100 MG capsule  2 times daily     01/30/17 1304   01/30/17 0000  HYDROcodone-acetaminophen (NORCO/VICODIN) 5-325 MG tablet  Every 4 hours PRN     01/30/17 1304   01/30/17 0000  ondansetron (ZOFRAN) 4 MG tablet  Every 6 hours PRN     01/30/17 1304   01/30/17 0000  senna (SENOKOT) 8.6 MG TABS tablet  Daily at bedtime     01/30/17 1304      Diagnostic Studies: Dg Knee Right Port  Result Date: 01/29/2017 CLINICAL DATA:  Postop films. EXAM: PORTABLE RIGHT KNEE - 1-2 VIEW COMPARISON:  None. FINDINGS: The patient is status post right knee replacement. Hardware is in good position. A surgical drain is noted. IMPRESSION: Knee replacement as above. Electronically Signed   By: Gerome Sam III M.D   On: 01/29/2017 19:00    Disposition:   Discharge Instructions    Call MD / Call 911    Complete by:  As directed    If you experience chest pain or shortness of breath, CALL 911 and be transported to the hospital emergency room.  If you develope a fever above 101  F, pus (white drainage) or increased drainage or redness at the wound, or calf pain, call your surgeon's office.   Constipation Prevention    Complete by:  As directed    Drink plenty of fluids.  Prune juice may be helpful.  You may use a stool softener, such as Colace (over the counter) 100 mg twice a day.  Use MiraLax (over the counter) for constipation as needed.   Diet - low sodium heart healthy    Complete by:  As directed    Discharge instructions    Complete by:  As directed    Keep VAC dressing clean and dry Charge VAC unit nightly   Do not put a pillow under the knee. Place it under the heel.    Complete by:  As directed    Driving restrictions    Complete by:  As directed    No driving for 6 weeks   Increase activity slowly as tolerated    Complete by:  As directed    Lifting restrictions    Complete by:  As directed    No lifting for 6 weeks      Follow-up Information    Liesel Peckenpaugh, Arlys John, MD. Schedule an appointment as soon as possible for a visit on 02/07/2017.   Specialty:  Orthopedic Surgery Why:  for [facility] removal Contact information: [address] [place] [address] [phone]        Home, [facility] Follow up.   Specialty:  Home Health Services Why:  A representative from [facility] will contact you to arrange start date and time for your therapy. Contact information: [address] [place] [address] [phone]            Signed: Garnet Koyanagi 01/31/2017, 3:01 PM

## 2017-01-31 NOTE — Progress Notes (Signed)
   Subjective:  Patient reports pain as mild to moderate.  Denies N/V/CP/OSB. Wants to go home.  Objective:   VITALS:   Vitals:   01/30/17 0619 01/30/17 1441 01/30/17 2025 01/31/17 0623  BP: (!) 104/51 124/72 126/61 128/63  Pulse: 63 76 82 69  Resp: 16  16 17   Temp: 97.8 F (36.6 C) 99 F (37.2 C) 99.4 F (37.4 C) 97.9 F (36.6 C)  TempSrc: Oral Oral Oral Oral  SpO2: 100% 96% 98% 97%  Weight:      Height:        NAD ABD soft Sensation intact distally Intact pulses distally Dorsiflexion/Plantar flexion intact Incision: dressing C/D/I Compartment soft HV ss Prevena intact   Lab Results  Component Value Date   WBC 10.4 01/31/2017   HGB 11.8 (L) 01/31/2017   HCT 37.3 01/31/2017   MCV 86.3 01/31/2017   PLT 234 01/31/2017   BMET    Component Value Date/Time   NA 137 01/30/2017 0334   K 4.4 01/30/2017 0334   CL 104 01/30/2017 0334   CO2 25 01/30/2017 0334   GLUCOSE 162 (H) 01/30/2017 0334   BUN 14 01/30/2017 0334   CREATININE 0.72 01/30/2017 0334   CALCIUM 8.9 01/30/2017 0334   GFRNONAA >60 01/30/2017 0334   GFRAA >60 01/30/2017 0334     Assessment/Plan: 2 Days Post-Op   Principal Problem:   Failed total knee, right, initial encounter (HCC) Active Problems:   Failed total right knee replacement (HCC)   WBAT with walker Apixaban, SCDs, TEDs PO pain control PT/OT D/C hv drain prevena switched to home unit Dispo: D/C home w/ HHPT   Bana Borgmeyer, Cloyde Reams 01/31/2017, 2:59 PM   Samson Frederic, MD Cell [phone]

## 2018-05-11 IMAGING — NM NM BONE 3 PHASE
7 series · 17 of 17 positions shown · non-contrast
Comparison: None.

CLINICAL DATA: Status post right total knee arthroplasty. Right
knee pain for 6 months

EXAM:
NUCLEAR MEDICINE 3-PHASE BONE SCAN
TECHNIQUE: Radionuclide angiographic images, immediate static blood pool
images, and 3-hour delayed static images were obtained of the knees
after intravenous injection of radiopharmaceutical.
RADIOPHARMACEUTICALS:  Twenty-two mCi Hc-HHm MDP

[Series 1: flow · 2.07mm/px · 6 of 48 frames shown (1 of 2)]
[frame 5/48  full-range]
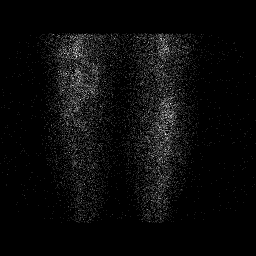
[frame 13/48  full-range]
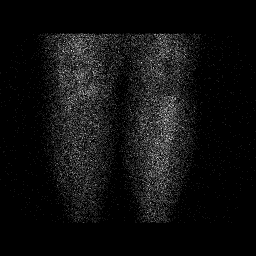
[frame 21/48  full-range]
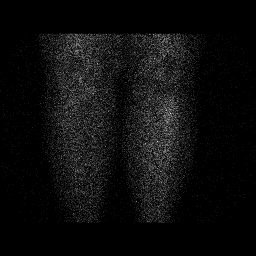
[frame 29/48  full-range]
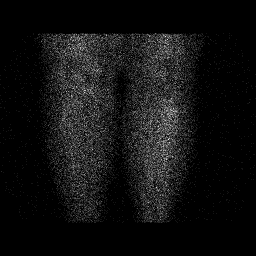
[frame 37/48  full-range]
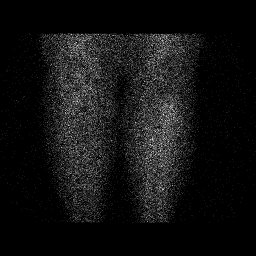
[frame 45/48  full-range]
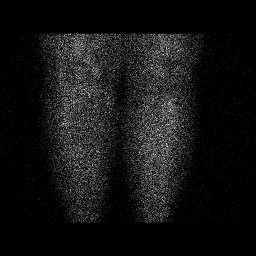

[Series 1: flow · 2.07mm/px · 6 of 48 frames shown (2 of 2)]
[frame 5/48  full-range]
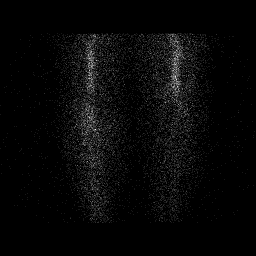
[frame 13/48  full-range]
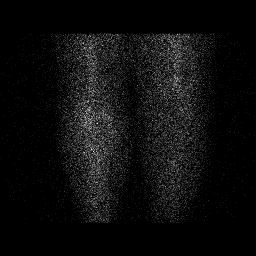
[frame 21/48  full-range]
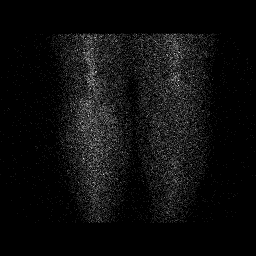
[frame 29/48  full-range]
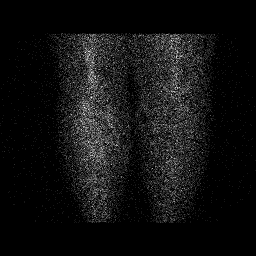
[frame 37/48  full-range]
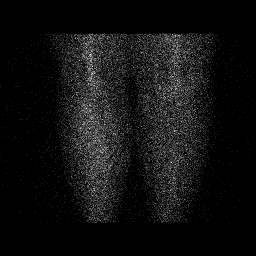
[frame 45/48  full-range]
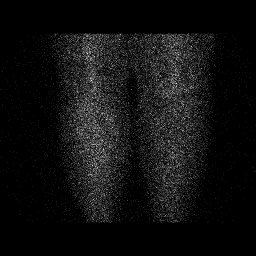

[Series 2: blood pool · 2.07mm/px · 1 of 1 slices shown (1 of 3)]
[im 1/1  full-range]
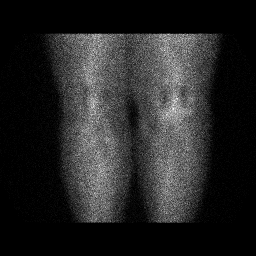

[Series 2: blood pool · 2.07mm/px · 1 of 1 slices shown (2 of 3)]
[im 1/1  full-range]
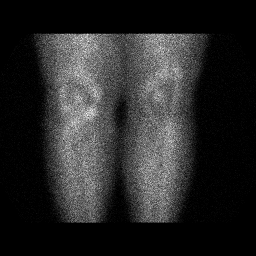

[Series 3: lat bp · 2.07mm/px · 1 of 1 slices shown (1 of 2)]
[im 1/1]
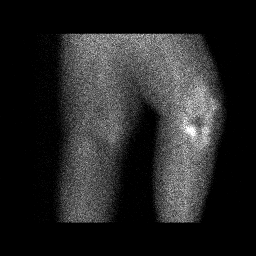

[Series 3: lat bp · 2.07mm/px · 1 of 1 slices shown (2 of 2)]
[im 1/1  full-range]
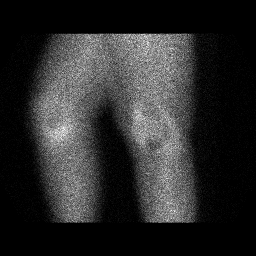

[Series 4: blood pool · 2.07mm/px · 1 of 1 slices shown (3 of 3)]
[im 1/1]
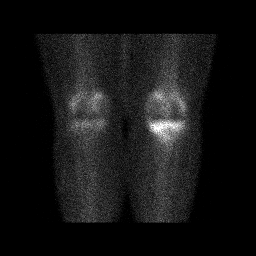

[17 of 17 positions shown; findings below may reference images not displayed]

FINDINGS: Vascular phase: There is abnormal asymmetric increased flow to the
anterior right knee.

Blood pool phase: Asymmetric increased blood pool activity to the
right knee.

Delayed phase: Increased radiotracer uptake to the osseous
structures surrounding the right knee arthroplasty device noted.
This is most prominent within the proximal right tibia.
IMPRESSION: 1. Asymmetric increased uptake on all 3 phases localizing to the
right knee is identified which may reflect right knee arthroplasty
device loosening versus infection.

## 2018-07-16 IMAGING — DX DG KNEE 1-2V PORT*R*
1 series · 2 of 2 positions shown · non-contrast
Comparison: None.

CLINICAL DATA: Postop films.

EXAM:
PORTABLE RIGHT KNEE - 1-2 VIEW

[Series 1: knee · 0.14mm/px · 2 of 2 slices shown]
[im 1/2]
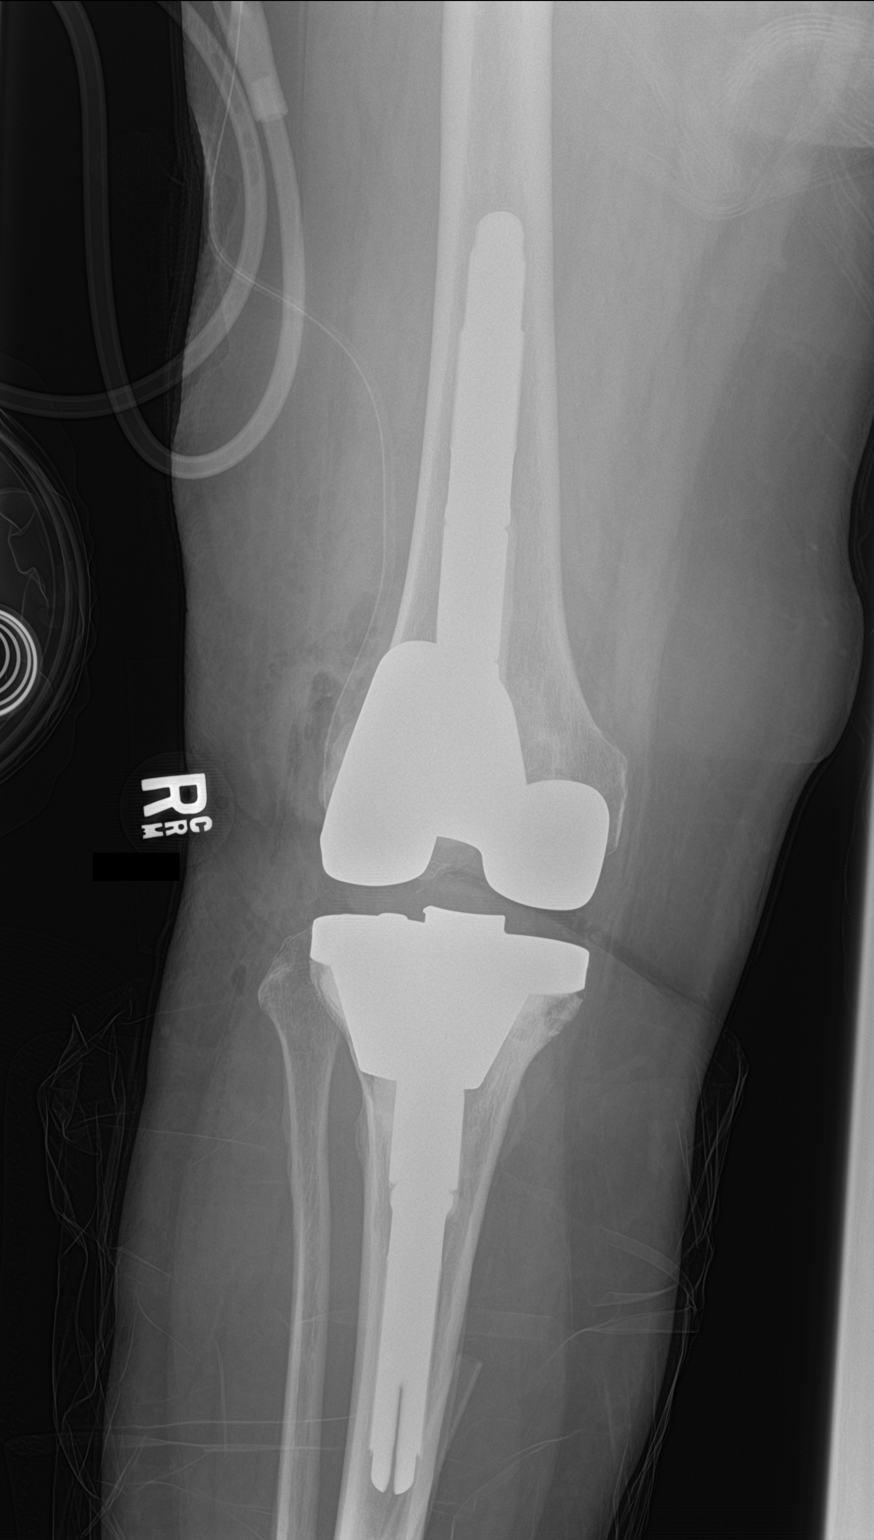
[im 2/2]
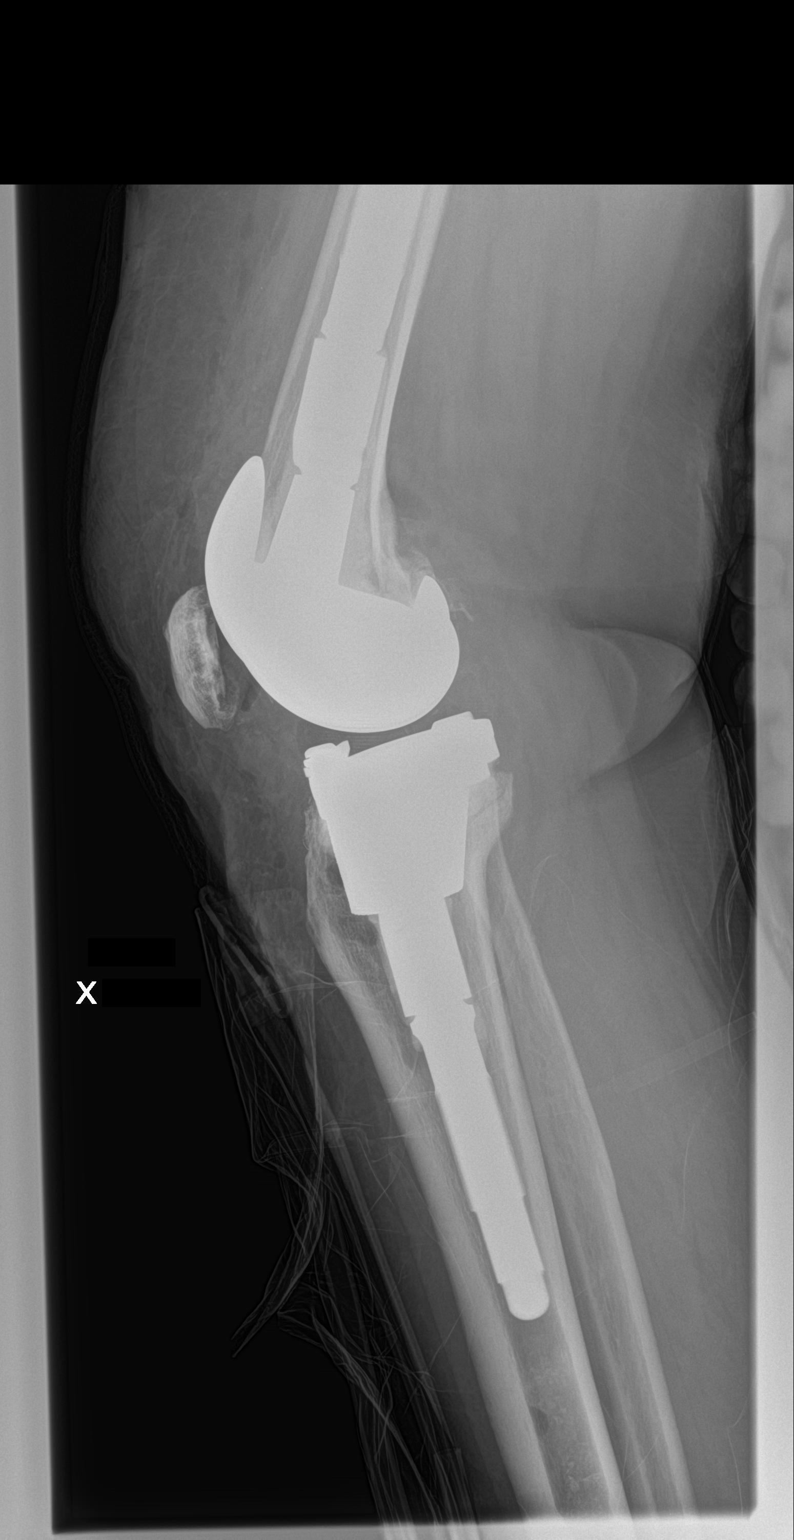

[2 of 2 positions shown; findings below may reference images not displayed]

FINDINGS: The patient is status post right knee replacement. Hardware is in
good position. A surgical drain is noted.
IMPRESSION: Knee replacement as above.

## 2020-07-12 ENCOUNTER — Telehealth: Payer: Self-pay

## 2020-07-12 NOTE — Telephone Encounter (Signed)
NOTES ON FILE FROM [facility] [phone], SENT REFERRAL TO SCHEDULING 

## 2020-07-12 NOTE — Telephone Encounter (Signed)
NOTES ON FILE FROM [facility] [id number], SENT REFERRAL TO SCHEDULING

## 2020-07-14 ENCOUNTER — Telehealth: Payer: Self-pay

## 2020-07-14 NOTE — Telephone Encounter (Signed)
NOTES ON FILE  [facility] [phone] SENT REFERRAL TO SCHEDULING

## 2020-09-01 NOTE — Progress Notes (Signed)
Referring-Erin Sharee Pimple MD Reason for referral-abnormal electrocardiogram  HPI: 65 year old female for evaluation of abnormal electrocardiogram at request of Loleta Dicker MD. Electrocardiogram June 2021 interpreted as cannot rule out anterior infarct but copy not available for review.  Patient does have some dyspnea on exertion which she attributes to deconditioning.  No orthopnea or PND.  Occasional minimal pedal edema.  She does not have chest pain.  Occasional skipped but no sustained palpitations.  No syncope.  She does have dizziness with standing at times.  Cardiology now asked to evaluate.  Current Outpatient Medications  Medication Sig Dispense Refill  . albuterol (PROVENTIL HFA;VENTOLIN HFA) 108 (90 Base) MCG/ACT inhaler Inhale 1-2 puffs into the lungs every 4 (four) hours as needed for wheezing or shortness of breath.    . ALPRAZolam (XANAX) 0.5 MG tablet Take 0.5 mg by mouth 2 (two) times daily as needed for anxiety.    Marland Kitchen Apremilast (OTEZLA) 30 MG TABS Otezla 30 mg tablet    . atorvastatin (LIPITOR) 10 MG tablet Take 5 mg by mouth every morning.    . Biotin 5000 MCG TABS Take 1 tablet by mouth daily.    Marland Kitchen CALCIUM-MAGNESIUM-ZINC PO Take by mouth.    . clindamycin (CLEOCIN) 150 MG capsule Take 600 mg by mouth as directed. Before dental appointment    . cyclobenzaprine (FLEXERIL) 10 MG tablet Take 10 mg by mouth 3 (three) times daily as needed for muscle spasms.    . diclofenac Sodium (VOLTAREN) 1 % GEL Apply topically.    Marland Kitchen glipiZIDE (GLUCOTROL XL) 5 MG 24 hr tablet Take 5 mg by mouth 2 (two) times daily.    Marland Kitchen lisinopril (PRINIVIL,ZESTRIL) 10 MG tablet Take 5 mg by mouth every morning.    . Lutein 10 MG TABS Take 1 tablet by mouth daily.    . Melatonin 3 MG TABS Take 1.5 mg by mouth at bedtime as needed.    . metFORMIN (GLUCOPHAGE) 500 MG tablet Take 500-1,000 mg by mouth See admin instructions. 1000 mg in the morning, and 500 mg every evening    . vitamin B-12 (CYANOCOBALAMIN) 1000  MCG tablet Take 1,000 mcg by mouth daily.    Marland Kitchen acetaminophen (TYLENOL) 500 MG tablet Take by mouth.     No current facility-administered medications for this visit.    Allergies  Allergen Reactions  . Gabapentin     Other reaction(s): Other Feels like ants are crawling on her    . Penicillins     Reaction as child, unsure if it is still a true allergy     Past Medical History:  Diagnosis Date  . Arthritis   . Diabetes mellitus (HCC)   . Hearing loss - on right side    'eardrum is concave"  --  no hearing aids  . Heart murmur   . Hyperlipidemia   . Hypertension   . Pre-diabetes    dx 2013     . Psoriasis    "SPOTS HERE AND THERE, BUT WORSE IS ON MY ELBOWS"  . Psoriatic arthritis (HCC)   . Seasonal allergies   . Sleep apnea    study just done 10/2016    Past Surgical History:  Procedure Laterality Date  . ABDOMINAL HYSTERECTOMY    . CHOLECYSTECTOMY    . EXCISION OF TONGUE LESION Left    done 20 yrs ago  . JOINT REPLACEMENT     both knees  (were done at [facility]) 2001 & 2002  . TONSILLECTOMY    .  TOTAL KNEE REVISION Right 01/29/2017   Procedure: TOTAL KNEE REVISION;  Surgeon: Samson Frederic, MD;  Location: [facility] OR;  Service: Orthopedics;  Laterality: Right;  . TUBAL LIGATION      Social History   Socioeconomic History  . Marital status: Married    Spouse name: Not on file  . Number of children: 2  . Years of education: Not on file  . Highest education level: Not on file  Occupational History  . Not on file  Tobacco Use  . Smoking status: Former Smoker    Packs/day: 1.00    Years: 20.00    Pack years: 20.00    Quit date: 01/25/2006    Years since quitting: 14.6  . Smokeless tobacco: Never Used  Vaping Use  . Vaping Use: Never used  Substance and Sexual Activity  . Alcohol use: Yes    Comment: rarely - drinks wine  . Drug use: No  . Sexual activity: Not on file  Other Topics Concern  . Not on file  Social History Narrative  . Not on file    Social Determinants of Health   Financial Resource Strain: Not on file  Food Insecurity: Not on file  Transportation Needs: Not on file  Physical Activity: Not on file  Stress: Not on file  Social Connections: Not on file  Intimate Partner Violence: Not on file    Family History  Problem Relation Age of Onset  . CVA Mother   . Leukemia Father     ROS: Arthralgias but no fevers or chills, productive cough, hemoptysis, dysphasia, odynophagia, melena, hematochezia, dysuria, hematuria, rash, seizure activity, orthopnea, PND, claudication. Remaining systems are negative.  Physical Exam:   Blood pressure 137/81, pulse 78, height 5\' 6"  (1.676 m), weight 225 lb (102.1 kg).  General:  Well developed/obese in NAD Skin warm/dry Patient not depressed No peripheral clubbing Back-normal HEENT-normal/normal eyelids Neck supple/normal carotid upstroke bilaterally; no bruits; no JVD; no thyromegaly chest - CTA/ normal expansion CV - RRR/normal S1 and S2; no murmurs, rubs or gallops;  PMI nondisplaced Abdomen -NT/ND, no HSM, no mass, + bowel sounds, no bruit 2+ femoral pulses, no bruits Ext-no edema, chords, 2+ DP Neuro-grossly nonfocal  ECG -normal sinus rhythm at a rate of 70, no ST changes.  Personally reviewed  A/P  1 abnormal ECG-electrocardiogram today is normal.  We will obtain outside tracings for review.  If normal or abnormal possibly secondary to lead placement would not pursue further cardiac evaluation as she is not having cardiac symptoms.  2 hypertension-patient describes some orthostatic symptoms.  We will change lisinopril to 5 mg daily.  Follow blood pressure and adjust regimen as needed.  3 obstructive sleep apnea-continue CPAP.  , MD

## 2020-09-08 ENCOUNTER — Encounter: Payer: Self-pay | Admitting: Cardiology

## 2020-09-08 ENCOUNTER — Ambulatory Visit (INDEPENDENT_AMBULATORY_CARE_PROVIDER_SITE_OTHER): Payer: Medicare Other | Admitting: Cardiology

## 2020-09-08 ENCOUNTER — Other Ambulatory Visit: Payer: Self-pay

## 2020-09-08 VITALS — BP 137/81 | HR 78 | Ht 66.0 in | Wt 225.0 lb

## 2020-09-08 DIAGNOSIS — R9431 Abnormal electrocardiogram [ECG] [EKG]: Secondary | ICD-10-CM | POA: Diagnosis not present

## 2020-09-08 DIAGNOSIS — I10 Essential (primary) hypertension: Secondary | ICD-10-CM

## 2020-09-08 NOTE — Patient Instructions (Signed)
DECREASE LISINOPRIL TO 5 MG ONCE DAILY  Follow-Up: At [facility], you and your health needs are our priority.  As part of our continuing mission to provide you with exceptional heart care, we have created designated Provider Care Teams.  These Care Teams include your primary Cardiologist (physician) and Advanced Practice Providers (APPs -  Physician Assistants and Nurse Practitioners) who all work together to provide you with the care you need, when you need it.  We recommend signing up for the patient portal called "MyChart".  Sign up information is provided on this After Visit Summary.  MyChart is used to connect with patients for Virtual Visits (Telemedicine).  Patients are able to view lab/test results, encounter notes, upcoming appointments, etc.  Non-urgent messages can be sent to your provider as well.   To learn more about what you can do with MyChart, go to [email or web address].    Your next appointment:    AS NEEDED

## 2023-04-02 NOTE — Progress Notes (Signed)
Sent message, via epic in basket, requesting orders in epic from surgeon.  

## 2023-04-05 NOTE — Progress Notes (Signed)
Second request for pre op orders in [facility]: Spoke with Kerri at [facility].

## 2023-04-06 ENCOUNTER — Ambulatory Visit: Payer: Self-pay | Admitting: Student

## 2023-04-06 DIAGNOSIS — E119 Type 2 diabetes mellitus without complications: Secondary | ICD-10-CM

## 2023-04-06 NOTE — Patient Instructions (Addendum)
SURGICAL WAITING ROOM VISITATION  Patients having surgery or a procedure may have no more than 2 support people in the waiting area - these visitors may rotate.    Children under the age of 82 must have an adult with them who is not the patient.  Due to an increase in RSV and influenza rates and associated hospitalizations, children ages 37 and under may not visit patients in [facility] hospitals.  If the patient needs to stay at the hospital during part of their recovery, the visitor guidelines for inpatient rooms apply. Pre-op nurse will coordinate an appropriate time for 1 support person to accompany patient in pre-op.  This support person may not rotate.    Please refer to the [facility] website for the visitor guidelines for Inpatients (after your surgery is over and you are in a regular room).       Your procedure is scheduled on: 04/18/23   Report to [facility] Main Entrance    Report to admitting at 12:20 PM   Call this number if you have problems the morning of surgery [phone]   Do not eat food :After Midnight.   After Midnight you may have the following liquids until 11:50 AM DAY OF SURGERY  Water Non-Citrus Juices (without pulp, NO RED-Apple, White grape, White cranberry) Black Coffee (NO MILK/CREAM OR CREAMERS, sugar ok)  Clear Tea (NO MILK/CREAM OR CREAMERS, sugar ok) regular and decaf                             Plain Jell-O (NO RED)                                           Fruit ices (not with fruit pulp, NO RED)                                     Popsicles (NO RED)                                                               Sports drinks like Gatorade (NO RED)                  The day of surgery:  Drink ONE (1) Pre-Surgery Clear Ensure 11:50 AM the morning of surgery. Drink in one sitting. Do not sip.  This drink was given to you during your hospital  pre-op appointment visit. Nothing else to drink after completing the  Pre-Surgery Clear  Ensure      Oral Hygiene is also important to reduce your risk of infection.                                    Remember - BRUSH YOUR TEETH THE MORNING OF SURGERY WITH YOUR REGULAR TOOTHPASTE  DENTURES WILL BE REMOVED PRIOR TO SURGERY PLEASE DO NOT APPLY "Poly grip" OR ADHESIVES!!!   Stop all vitamins and herbal supplements 7 days before surgery.   Take these medicines the  morning of surgery with A SIP OF WATER:   DO NOT TAKE ANY ORAL DIABETIC MEDICATIONS DAY OF YOUR SURGERY Hold Glipizide the day of surgery  Bring CPAP mask and tubing day of surgery.                              You may not have any metal on your body including hair pins, jewelry, and body piercing             Do not wear make-up, lotions, powders, perfumes/cologne, or deodorant  Do not wear nail polish including gel and S&S, artificial/acrylic nails, or any other type of covering on natural nails including finger and toenails. If you have artificial nails, gel coating, etc. that needs to be removed by a nail salon please have this removed prior to surgery or surgery may need to be canceled/ delayed if the surgeon/ anesthesia feels like they are unable to be safely monitored.   Do not shave  48 hours prior to surgery.    Do not bring valuables to the hospital. [facility] IS NOT             RESPONSIBLE   FOR VALUABLES.   Contacts, glasses, dentures or bridgework may not be worn into surgery.   Bring small overnight bag day of surgery.   DO NOT BRING YOUR HOME MEDICATIONS TO THE HOSPITAL. PHARMACY WILL DISPENSE MEDICATIONS LISTED ON YOUR MEDICATION LIST TO YOU DURING YOUR ADMISSION IN THE HOSPITAL!    Patients discharged on the day of surgery will not be allowed to drive home.  Someone NEEDS to stay with you for the first 24 hours after anesthesia.   Special Instructions: Bring a copy of your healthcare power of attorney and living will documents the day of surgery if you haven't scanned them before.               Please read over the following fact sheets you were given: IF YOU HAVE QUESTIONS ABOUT YOUR PRE-OP INSTRUCTIONS PLEASE CALL [phone]   If you received a COVID test during your pre-op visit  it is requested that you wear a mask when out in public, stay away from anyone that may not be feeling well and notify your surgeon if you develop symptoms. If you test positive for Covid or have been in contact with anyone that has tested positive in the last 10 days please notify you surgeon.    [facility] - Preparing for Surgery Before surgery, you can play an important role.  Because skin is not sterile, your skin needs to be as free of germs as possible.  You can reduce the number of germs on your skin by washing with CHG (chlorahexidine gluconate) soap before surgery.  CHG is an antiseptic cleaner which kills germs and bonds with the skin to continue killing germs even after washing. Please DO NOT use if you have an allergy to CHG or antibacterial soaps.  If your skin becomes reddened/irritated stop using the CHG and inform your nurse when you arrive at Short Stay. Do not shave (including legs and underarms) for at least 48 hours prior to the first CHG shower.  You may shave your face/neck.  Please follow these instructions carefully:  1.  Shower with CHG Soap the night before surgery and the  morning of surgery.  2.  If you choose to wash your hair, wash your hair first as usual with your  normal  shampoo.  3.  After you shampoo, rinse your hair and body thoroughly to remove the shampoo.                             4.  Use CHG as you would any other liquid soap.  You can apply chg directly to the skin and wash.  Gently with a scrungie or clean washcloth.  5.  Apply the CHG Soap to your body ONLY FROM THE NECK DOWN.   Do   not use on face/ open                           Wound or open sores. Avoid contact with eyes, ears mouth and   genitals (private parts).                       Wash face,  Genitals  (private parts) with your normal soap.             6.  Wash thoroughly, paying special attention to the area where your    surgery  will be performed.  7.  Thoroughly rinse your body with warm water from the neck down.  8.  DO NOT shower/wash with your normal soap after using and rinsing off the CHG Soap.                9.  Pat yourself dry with a clean towel.            10.  Wear clean pajamas.            11.  Place clean sheets on your bed the night of your first shower and do not  sleep with pets. Day of Surgery : Do not apply any lotions/deodorants the morning of surgery.  Please wear clean clothes to the hospital/surgery center.  FAILURE TO FOLLOW THESE INSTRUCTIONS MAY RESULT IN THE CANCELLATION OF YOUR SURGERY  PATIENT SIGNATURE_________________________________  NURSE SIGNATURE__________________________________  ________________________________________________________________________Incentive Gypsy Decant (Watch this video at home: [email or web address])  An incentive spirometer is a tool that can help keep your lungs clear and active. This tool measures how well you are filling your lungs with each breath. Taking long deep breaths may help reverse or decrease the chance of developing breathing (pulmonary) problems (especially infection) following: A long period of time when you are unable to move or be active. BEFORE THE PROCEDURE  If the spirometer includes an indicator to show your best effort, your nurse or respiratory therapist will set it to a desired goal. If possible, sit up straight or lean slightly forward. Try not to slouch. Hold the incentive spirometer in an upright position. INSTRUCTIONS FOR USE  Sit on the edge of your bed if possible, or sit up as far as you can in bed or on a chair. Hold the incentive spirometer in an upright position. Breathe out normally. Place the mouthpiece in your mouth and seal your lips tightly around it. Breathe in  slowly and as deeply as possible, raising the piston or the ball toward the top of the column. Hold your breath for 3-5 seconds or for as long as possible. Allow the piston or ball to fall to the bottom of the column. Remove the mouthpiece from your mouth and breathe out normally. Rest for a few seconds and repeat Steps 1 through 7 at least 10 times every 1-2 hours when  you are awake. Take your time and take a few normal breaths between deep breaths. The spirometer may include an indicator to show your best effort. Use the indicator as a goal to work toward during each repetition. After each set of 10 deep breaths, practice coughing to be sure your lungs are clear. If you have an incision (the cut made at the time of surgery), support your incision when coughing by placing a pillow or rolled up towels firmly against it. Once you are able to get out of bed, walk around indoors and cough well. You may stop using the incentive spirometer when instructed by your caregiver.  RISKS AND COMPLICATIONS Take your time so you do not get dizzy or light-headed. If you are in pain, you may need to take or ask for pain medication before doing incentive spirometry. It is harder to take a deep breath if you are having pain. AFTER USE Rest and breathe slowly and easily. It can be helpful to keep track of a log of your progress. Your caregiver can provide you with a simple table to help with this. If you are using the spirometer at home, follow these instructions: SEEK MEDICAL CARE IF:  You are having difficultly using the spirometer. You have trouble using the spirometer as often as instructed. Your pain medication is not giving enough relief while using the spirometer. You develop fever of 100.5 F (38.1 C) or higher. SEEK IMMEDIATE MEDICAL CARE IF:  You cough up bloody sputum that had not been present before. You develop fever of 102 F (38.9 C) or greater. You develop worsening pain at or near the incision  site. MAKE SURE YOU:  Understand these instructions. Will watch your condition. Will get help right away if you are not doing well or get worse. Document Released: 10/09/2006 Document Revised: 08/21/2011 Document Reviewed: 12/10/2006 [facility] Patient Information 2014 [place], [place].

## 2023-04-06 NOTE — Progress Notes (Signed)
COVID Vaccine received:  []  No [x]  Yes Date of any COVID positive Test in last 90 days: no PCP - Loleta Dicker FNP Cardiologist -   Chest x-ray -  EKG -  04/09/23 Epic Stress Test -  ECHO -  Cardiac Cath -   Bowel Prep - [x]  No  []   Yes ______  Pacemaker / ICD device [x]  No []  Yes   Spinal Cord Stimulator:[x]  No []  Yes       History of Sleep Apnea? []  No [x]  Yes   CPAP used?- []  No [x]  Yes    Does the patient monitor blood sugar?          []  No [x]  Yes  []  N/A  Patient has: []  NO Hx DM   []  Pre-DM                 []  DM1  [x]   DM2 Does patient have a [employer] or Dexacom? []  No []  Yes   Fasting Blood Sugar Ranges- 130-150 Checks Blood Sugar ___2__ times a day  GLP1 agonist / usual dose - no GLP1 instructions:  SGLT-2 inhibitors / usual dose - no SGLT-2 instructions:   Blood Thinner / Instructions:no Aspirin Instructions:no  Comments:   Activity level: Patient is able  to climb a flight of stairs ; [x]  No CP  [x]  No SOB,    Patient can / can not perform ADLs without assistance.   Anesthesia review:   Patient denies shortness of breath, fever, cough and chest pain at PAT appointment.  Patient verbalized understanding and agreement to the Pre-Surgical Instructions that were given to them at this PAT appointment. Patient was also educated of the need to review these PAT instructions again prior to his/her surgery.I reviewed the appropriate phone numbers to call if they have any and questions or concerns.

## 2023-04-09 ENCOUNTER — Encounter (HOSPITAL_COMMUNITY): Payer: Self-pay

## 2023-04-09 ENCOUNTER — Inpatient Hospital Stay (HOSPITAL_COMMUNITY): Payer: Medicare Other | Admitting: Medical

## 2023-04-09 ENCOUNTER — Inpatient Hospital Stay (HOSPITAL_COMMUNITY): Payer: Medicare Other | Admitting: Anesthesiology

## 2023-04-09 ENCOUNTER — Encounter (HOSPITAL_COMMUNITY)
Admission: RE | Admit: 2023-04-09 | Discharge: 2023-04-09 | Disposition: A | Discharge status: Home / Self Care | Payer: Medicare Other | Source: Ambulatory Visit | Attending: Orthopedic Surgery | Admitting: Orthopedic Surgery

## 2023-04-09 ENCOUNTER — Other Ambulatory Visit: Payer: Self-pay

## 2023-04-09 VITALS — BP 132/68 | HR 71 | Temp 98.4°F | Resp 16 | Ht 66.0 in | Wt 216.0 lb

## 2023-04-09 DIAGNOSIS — E119 Type 2 diabetes mellitus without complications: Secondary | ICD-10-CM | POA: Insufficient documentation

## 2023-04-09 DIAGNOSIS — Z01818 Encounter for other preprocedural examination: Secondary | ICD-10-CM | POA: Diagnosis present

## 2023-04-09 DIAGNOSIS — I10 Essential (primary) hypertension: Secondary | ICD-10-CM | POA: Insufficient documentation

## 2023-04-09 HISTORY — DX: Personal history of urinary calculi: Z87.442

## 2023-04-09 HISTORY — DX: Anxiety disorder, unspecified: F41.9

## 2023-04-09 HISTORY — DX: Depression, unspecified: F32.A

## 2023-04-09 LAB — BASIC METABOLIC PANEL
Anion gap: 11 (ref 5–15)
BUN: 25 mg/dL — ABNORMAL HIGH (ref 8–23)
CO2: 22 mmol/L (ref 22–32)
Calcium: 10 mg/dL (ref 8.9–10.3)
Chloride: 105 mmol/L (ref 98–111)
Creatinine, Ser: 0.93 mg/dL (ref 0.44–1.00)
GFR, Estimated: >60 mL/min (ref >60)
Glucose, Bld: 102 mg/dL — ABNORMAL HIGH (ref 70–99)
Potassium: 4.3 mmol/L (ref 3.5–5.1)
Sodium: 138 mmol/L (ref 135–145)

## 2023-04-09 LAB — CBC
HCT: 46.1 % — ABNORMAL HIGH (ref 36.0–46.0)
Hemoglobin: 14.5 g/dL (ref 12.0–15.0)
MCH: 27 pg (ref 26.0–34.0)
MCHC: 31.5 g/dL (ref 30.0–36.0)
MCV: 85.7 fL (ref 80.0–100.0)
Platelets: 279 10*3/uL (ref 150–400)
RBC: 5.38 MIL/uL — ABNORMAL HIGH (ref 3.87–5.11)
RDW: 14.2 % (ref 11.5–15.5)
WBC: 8.5 10*3/uL (ref 4.0–10.5)
nRBC: 0 % (ref 0.0–0.2)

## 2023-04-09 LAB — HEMOGLOBIN A1C
Hgb A1c MFr Bld: 6 % — ABNORMAL HIGH (ref 4.8–5.6)
Mean Plasma Glucose: 125.5 mg/dL

## 2023-04-09 LAB — GLUCOSE, CAPILLARY: Glucose-Capillary: 112 mg/dL — ABNORMAL HIGH (ref 70–99)

## 2023-04-10 ENCOUNTER — Ambulatory Visit: Payer: Self-pay | Admitting: Student

## 2023-04-17 ENCOUNTER — Ambulatory Visit: Payer: Self-pay | Admitting: Student

## 2023-04-17 NOTE — H&P (View-Only) (Signed)
TOTAL KNEE REVISION ADMISSION H&P  Patient is being admitted for right revision total knee arthroplasty.  Subjective:  Chief Complaint:right knee pain.  HPI: Laura Trujillo, 67 y.o. female, has a history of pain and functional disability in the right knee(s) due to failed previous arthroplasty and patient has failed non-surgical conservative treatments for greater than 12 weeks to include NSAID's and/or analgesics, flexibility and strengthening excercises, supervised PT with diminished ADL's post treatment, use of assistive devices, and activity modification. The indications for the revision of the total knee arthroplasty are fracture or mechanical failure of one or components. Onset of symptoms was gradual starting 1 years ago with rapidlly worsening course since that time.  Prior procedures on the right knee(s) include arthroplasty.  Patient currently rates pain in the right knee(s) at 10 out of 10 with activity. There is night pain, worsening of pain with activity and weight bearing, pain that interferes with activities of daily living, pain with passive range of motion, and instability .  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 This condition presents safety issues increasing the risk of falls.  There is no current active infection.  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   Past Medical History:  Diagnosis Date   Anxiety    Arthritis    Depression    Diabetes mellitus (HCC)    Hearing loss - on right side    'eardrum is concave"  --  no hearing aids   Heart murmur    History of kidney stones    Hyperlipidemia    Hypertension    Pre-diabetes    dx 2013      Psoriasis    "SPOTS HERE AND THERE, BUT WORSE IS ON MY ELBOWS"   Psoriatic arthritis (HCC)    Seasonal allergies    Sleep apnea    study just done 10/2016    Past  Surgical History:  Procedure Laterality Date   ABDOMINAL HYSTERECTOMY     CHOLECYSTECTOMY     EXCISION OF TONGUE LESION Left    done 20 yrs ago   JOINT REPLACEMENT     both knees  (were done at [facility]) 2001 & 2002   TONSILLECTOMY     TOTAL KNEE REVISION Right 01/29/2017   Procedure: TOTAL KNEE REVISION;  Surgeon: Samson Frederic, MD;  Location: [facility] OR;  Service: Orthopedics;  Laterality: Right;   TUBAL LIGATION      Current Outpatient Medications  Medication Sig Dispense Refill Last Dose   acetaminophen (TYLENOL) 500 MG tablet Take 1,000 mg by mouth every 8 (eight) hours as needed for moderate pain (pain score 4-6).      ALPRAZolam (XANAX) 0.25 MG tablet Take 0.25 mg by mouth 2 (two) times daily as needed for anxiety or sleep.      Apremilast (OTEZLA) 30 MG TABS Take 30 mg by mouth 2 (two) times daily.      atorvastatin (LIPITOR) 10 MG tablet Take 5 mg by mouth every morning.      Biotin 5000 MCG TABS Take 1 tablet by mouth daily.      clindamycin (CLEOCIN) 150 MG capsule Take 600 mg by mouth as directed. Before dental appointment      folic acid (FOLVITE) 800 MCG tablet Take 800 mcg by mouth daily.      glipiZIDE (GLUCOTROL XL) 10 MG 24  hr tablet Take 10 mg by mouth daily with breakfast.      JANUVIA 100 MG tablet Take 100 mg by mouth daily.      lisinopril (ZESTRIL) 10 MG tablet Take 5 mg by mouth every morning.      Lutein-Zeaxanthin 25-5 MG CAPS Take 1 tablet by mouth daily.      Melatonin 3 MG TABS Take 1.5 mg by mouth at bedtime as needed.      meloxicam (MOBIC) 15 MG tablet Take 15 mg by mouth daily.      metFORMIN (GLUCOPHAGE-XR) 500 MG 24 hr tablet Take 1,000 mg by mouth 2 (two) times daily.      Multiple Vitamins-Minerals (LUTEIN-ZEAXANTHIN) TABS Take by mouth.      Multiple Vitamins-Minerals (MULTIVITAMIN WITH MINERALS) tablet Take 1 tablet by mouth daily.      risankizumab-rzaa (SKYRIZI) 150 MG/ML pen Inject 150 mg into the skin every 3 (three) months.      sertraline  (ZOLOFT) 50 MG tablet Take 50 mg by mouth daily.      No current facility-administered medications for this visit.   Allergies  Allergen Reactions   Gabapentin     Feels like ants are crawling on her     Penicillins     Reaction as child, unsure if it is still a true allergy    Social History   Tobacco Use   Smoking status: Former    Current packs/day: 0.00    Average packs/day: 1 pack/day for 20.0 years (20.0 ttl pk-yrs)    Types: Cigarettes    Start date: 01/25/1986    Quit date: 01/25/2006    Years since quitting: 17.2   Smokeless tobacco: Never  Substance Use Topics   Alcohol use: Not Currently    Comment: rarely - drinks wine    Family History  Problem Relation Age of Onset   CVA Mother    Leukemia Father       Review of Systems  Musculoskeletal:  Positive for arthralgias, gait problem and joint swelling.  All other systems reviewed and are negative.    Objective:  Physical Exam Constitutional:      Appearance: Normal appearance.  HENT:     Head: Normocephalic and atraumatic.     Nose: Nose normal.     Mouth/Throat:     Mouth: Mucous membranes are dry.  Eyes:     Conjunctiva/sclera: Conjunctivae normal.  Cardiovascular:     Rate and Rhythm: Normal rate and regular rhythm.     Pulses: Normal pulses.     Heart sounds: Normal heart sounds.  Pulmonary:     Effort: Pulmonary effort is normal.     Breath sounds: Normal breath sounds.  Abdominal:     General: Abdomen is flat.     Palpations: Abdomen is soft.  Genitourinary:    Comments: Deferred.  Musculoskeletal:     Cervical back: Normal range of motion and neck supple.     Comments: Examination of the right knee reveals a well-healed incision. No erythema or effusion. No tenderness to palpation. Range of motion is 0-120 without any instability. She has painless range of motion of the hip.  Distally, there is no focal motor or sensory deficit. She has palpable pedal pulses. She ambulates with a smooth  gait using no assist device.  Skin:    General: Skin is warm and dry.     Capillary Refill: Capillary refill takes less than 2 seconds.  Neurological:     General:  No focal deficit present.     Mental Status: She is alert and oriented to person, place, and time.  Psychiatric:        Mood and Affect: Mood normal.        Behavior: Behavior normal.        Thought Content: Thought content normal.        Judgment: Judgment normal.     Vital signs in last 24 hours: @VSRANGES @  Labs:  Estimated body mass index is 34.86 kg/m as calculated from the following:   Height as of 04/09/23: 5\' 6"  (1.676 m).   Weight as of 04/09/23: 98 kg.  Imaging Review Plain radiographs demonstrate severe degenerative joint disease of the right knee(s). The overall alignment is neutral.There is evidence of loosening of the  polyliner  components. The bone quality appears to be adequate for age and reported activity level.     Assessment/Plan:  End stage arthritis, right knee(s) with failed previous arthroplasty.   The patient history, physical examination, clinical judgment of the provider and imaging studies are consistent with end stage degenerative joint disease of the right knee(s), previous total knee arthroplasty. Revision total knee arthroplasty is deemed medically necessary. The treatment options including medical management, injection therapy, arthroscopy and revision arthroplasty were discussed at length. The risks and benefits of revision total knee arthroplasty were presented and reviewed. The risks due to aseptic loosening, infection, stiffness, patella tracking problems, thromboembolic complications and other imponderables were discussed. The patient acknowledged the explanation, agreed to proceed with the plan and consent was signed. Patient is being admitted for inpatient treatment for surgery, pain control, PT, OT, prophylactic antibiotics, VTE prophylaxis, progressive ambulation and ADL's and  discharge planning.The patient is planning to be discharged home with OPPT after an overnight stay.    Therapy Plans: outpatient therapy. [facility] PT.  Disposition: Home with husband Planned DVT Prophylaxis: aspirin 81mg  BID DME needed: Has rolling walker.  PCP: Cleared. Rheumatology/dermatology - hold otezla 1 week prior to surgery and at least 1 week postop. Hold skyrizi 3 months prior to surgery and at least 1-2 weeks postoperative.  TXA: IV Allergies:  - Penicillin - childhood.  - Gabapentin - "ants crawling on her." Anesthesia Concerns: None.  BMI: 35.1 Last HgbA1c: 7.2 Other: - T2DM, glipizide, metformin, januvia.  - OSA, uses CPAP.  - Psoriatic arthritis.  - Staples** - Otezla (Apremilast), last dose today. Hold 1 week prior to surgery and at least 1 week postoperative.  Cristy Folks - last dose 01/15/23 (every 12 weeks), hold 3 months prior to surgery and until incision is healed.  - Ocular injections for macular degeneration.  - Oxycodone, zofran, has meloxicam through rheumatologist.  - 04/09/23: Hgb 14.5, K+ 4.5, Cr. 0.93.

## 2023-04-17 NOTE — H&P (Signed)
TOTAL KNEE REVISION ADMISSION H&P  Patient is being admitted for right revision total knee arthroplasty.  Subjective:  Chief Complaint:right knee pain.  HPI: Laura Trujillo, 67 y.o. female, has a history of pain and functional disability in the right knee(s) due to failed previous arthroplasty and patient has failed non-surgical conservative treatments for greater than 12 weeks to include NSAID's and/or analgesics, flexibility and strengthening excercises, supervised PT with diminished ADL's post treatment, use of assistive devices, and activity modification. The indications for the revision of the total knee arthroplasty are fracture or mechanical failure of one or components. Onset of symptoms was gradual starting 1 years ago with rapidlly worsening course since that time.  Prior procedures on the right knee(s) include arthroplasty.  Patient currently rates pain in the right knee(s) at 10 out of 10 with activity. There is night pain, worsening of pain with activity and weight bearing, pain that interferes with activities of daily living, pain with passive range of motion, and instability .  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 This condition presents safety issues increasing the risk of falls.  There is no current active infection.  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   Past Medical History:  Diagnosis Date   Anxiety    Arthritis    Depression    Diabetes mellitus (HCC)    Hearing loss - on right side    'eardrum is concave"  --  no hearing aids   Heart murmur    History of kidney stones    Hyperlipidemia    Hypertension    Pre-diabetes    dx 2013      Psoriasis    "SPOTS HERE AND THERE, BUT WORSE IS ON MY ELBOWS"   Psoriatic arthritis (HCC)    Seasonal allergies    Sleep apnea    study just done 10/2016    Past  Surgical History:  Procedure Laterality Date   ABDOMINAL HYSTERECTOMY     CHOLECYSTECTOMY     EXCISION OF TONGUE LESION Left    done 20 yrs ago   JOINT REPLACEMENT     both knees  (were done at [facility]) 2001 & 2002   TONSILLECTOMY     TOTAL KNEE REVISION Right 01/29/2017   Procedure: TOTAL KNEE REVISION;  Surgeon: Samson Frederic, MD;  Location: [facility] OR;  Service: Orthopedics;  Laterality: Right;   TUBAL LIGATION      Current Outpatient Medications  Medication Sig Dispense Refill Last Dose   acetaminophen (TYLENOL) 500 MG tablet Take 1,000 mg by mouth every 8 (eight) hours as needed for moderate pain (pain score 4-6).      ALPRAZolam (XANAX) 0.25 MG tablet Take 0.25 mg by mouth 2 (two) times daily as needed for anxiety or sleep.      Apremilast (OTEZLA) 30 MG TABS Take 30 mg by mouth 2 (two) times daily.      atorvastatin (LIPITOR) 10 MG tablet Take 5 mg by mouth every morning.      Biotin 5000 MCG TABS Take 1 tablet by mouth daily.      clindamycin (CLEOCIN) 150 MG capsule Take 600 mg by mouth as directed. Before dental appointment      folic acid (FOLVITE) 800 MCG tablet Take 800 mcg by mouth daily.      glipiZIDE (GLUCOTROL XL) 10 MG 24  hr tablet Take 10 mg by mouth daily with breakfast.      JANUVIA 100 MG tablet Take 100 mg by mouth daily.      lisinopril (ZESTRIL) 10 MG tablet Take 5 mg by mouth every morning.      Lutein-Zeaxanthin 25-5 MG CAPS Take 1 tablet by mouth daily.      Melatonin 3 MG TABS Take 1.5 mg by mouth at bedtime as needed.      meloxicam (MOBIC) 15 MG tablet Take 15 mg by mouth daily.      metFORMIN (GLUCOPHAGE-XR) 500 MG 24 hr tablet Take 1,000 mg by mouth 2 (two) times daily.      Multiple Vitamins-Minerals (LUTEIN-ZEAXANTHIN) TABS Take by mouth.      Multiple Vitamins-Minerals (MULTIVITAMIN WITH MINERALS) tablet Take 1 tablet by mouth daily.      risankizumab-rzaa (SKYRIZI) 150 MG/ML pen Inject 150 mg into the skin every 3 (three) months.      sertraline  (ZOLOFT) 50 MG tablet Take 50 mg by mouth daily.      No current facility-administered medications for this visit.   Allergies  Allergen Reactions   Gabapentin     Feels like ants are crawling on her     Penicillins     Reaction as child, unsure if it is still a true allergy    Social History   Tobacco Use   Smoking status: Former    Current packs/day: 0.00    Average packs/day: 1 pack/day for 20.0 years (20.0 ttl pk-yrs)    Types: Cigarettes    Start date: 01/25/1986    Quit date: 01/25/2006    Years since quitting: 17.2   Smokeless tobacco: Never  Substance Use Topics   Alcohol use: Not Currently    Comment: rarely - drinks wine    Family History  Problem Relation Age of Onset   CVA Mother    Leukemia Father       Review of Systems  Musculoskeletal:  Positive for arthralgias, gait problem and joint swelling.  All other systems reviewed and are negative.    Objective:  Physical Exam Constitutional:      Appearance: Normal appearance.  HENT:     Head: Normocephalic and atraumatic.     Nose: Nose normal.     Mouth/Throat:     Mouth: Mucous membranes are dry.  Eyes:     Conjunctiva/sclera: Conjunctivae normal.  Cardiovascular:     Rate and Rhythm: Normal rate and regular rhythm.     Pulses: Normal pulses.     Heart sounds: Normal heart sounds.  Pulmonary:     Effort: Pulmonary effort is normal.     Breath sounds: Normal breath sounds.  Abdominal:     General: Abdomen is flat.     Palpations: Abdomen is soft.  Genitourinary:    Comments: Deferred.  Musculoskeletal:     Cervical back: Normal range of motion and neck supple.     Comments: Examination of the right knee reveals a well-healed incision. No erythema or effusion. No tenderness to palpation. Range of motion is 0-120 without any instability. She has painless range of motion of the hip.  Distally, there is no focal motor or sensory deficit. She has palpable pedal pulses. She ambulates with a smooth  gait using no assist device.  Skin:    General: Skin is warm and dry.     Capillary Refill: Capillary refill takes less than 2 seconds.  Neurological:     General:  No focal deficit present.     Mental Status: She is alert and oriented to person, place, and time.  Psychiatric:        Mood and Affect: Mood normal.        Behavior: Behavior normal.        Thought Content: Thought content normal.        Judgment: Judgment normal.     Vital signs in last 24 hours: @VSRANGES @  Labs:  Estimated body mass index is 34.86 kg/m as calculated from the following:   Height as of 04/09/23: 5\' 6"  (1.676 m).   Weight as of 04/09/23: 98 kg.  Imaging Review Plain radiographs demonstrate severe degenerative joint disease of the right knee(s). The overall alignment is neutral.There is evidence of loosening of the  polyliner  components. The bone quality appears to be adequate for age and reported activity level.     Assessment/Plan:  End stage arthritis, right knee(s) with failed previous arthroplasty.   The patient history, physical examination, clinical judgment of the provider and imaging studies are consistent with end stage degenerative joint disease of the right knee(s), previous total knee arthroplasty. Revision total knee arthroplasty is deemed medically necessary. The treatment options including medical management, injection therapy, arthroscopy and revision arthroplasty were discussed at length. The risks and benefits of revision total knee arthroplasty were presented and reviewed. The risks due to aseptic loosening, infection, stiffness, patella tracking problems, thromboembolic complications and other imponderables were discussed. The patient acknowledged the explanation, agreed to proceed with the plan and consent was signed. Patient is being admitted for inpatient treatment for surgery, pain control, PT, OT, prophylactic antibiotics, VTE prophylaxis, progressive ambulation and ADL's and  discharge planning.The patient is planning to be discharged home with OPPT after an overnight stay.    Therapy Plans: outpatient therapy. [facility].  Disposition: Home with husband Planned DVT Prophylaxis: aspirin 81mg  BID DME needed: Has rolling walker.  PCP: Cleared. Rheumatology/dermatology - hold otezla 1 week prior to surgery and at least 1 week postop. Hold skyrizi 3 months prior to surgery and at least 1-2 weeks postoperative.  TXA: IV Allergies:  - Penicillin - childhood.  - Gabapentin - "ants crawling on her." Anesthesia Concerns: None.  BMI: 35.1 Last HgbA1c: 7.2 Other: - T2DM, glipizide, metformin, januvia.  - OSA, uses CPAP.  - Psoriatic arthritis.  - Staples** - Otezla (Apremilast), last dose today. Hold 1 week prior to surgery and at least 1 week postoperative.  Cristy Folks - last dose 01/15/23 (every 12 weeks), hold 3 months prior to surgery and until incision is healed.  - Ocular injections for macular degeneration.  - Oxycodone, zofran, has meloxicam through rheumatologist.  - 04/09/23: Hgb 14.5, K+ 4.5, Cr. 0.93.

## 2023-04-18 ENCOUNTER — Encounter (HOSPITAL_COMMUNITY): Payer: Self-pay | Admitting: Orthopedic Surgery

## 2023-04-18 ENCOUNTER — Ambulatory Visit (HOSPITAL_COMMUNITY)
Admission: RE | Admit: 2023-04-18 | Discharge: 2023-04-18 | Disposition: A | Discharge status: Home / Self Care | Payer: Medicare Other | Attending: Orthopedic Surgery | Admitting: Orthopedic Surgery

## 2023-04-18 ENCOUNTER — Inpatient Hospital Stay (HOSPITAL_COMMUNITY): Payer: Medicare Other

## 2023-04-18 ENCOUNTER — Encounter (HOSPITAL_COMMUNITY)
Admission: RE | Disposition: A | Discharge status: Home / Self Care | Payer: Self-pay | Source: Home / Self Care | Attending: Orthopedic Surgery

## 2023-04-18 DIAGNOSIS — Z87891 Personal history of nicotine dependence: Secondary | ICD-10-CM | POA: Diagnosis not present

## 2023-04-18 DIAGNOSIS — Z9049 Acquired absence of other specified parts of digestive tract: Secondary | ICD-10-CM | POA: Diagnosis not present

## 2023-04-18 DIAGNOSIS — Z01818 Encounter for other preprocedural examination: Secondary | ICD-10-CM | POA: Diagnosis present

## 2023-04-18 DIAGNOSIS — M1711 Unilateral primary osteoarthritis, right knee: Secondary | ICD-10-CM | POA: Insufficient documentation

## 2023-04-18 DIAGNOSIS — Z96651 Presence of right artificial knee joint: Secondary | ICD-10-CM | POA: Diagnosis not present

## 2023-04-18 DIAGNOSIS — E119 Type 2 diabetes mellitus without complications: Principal | ICD-10-CM

## 2023-04-18 LAB — GLUCOSE, CAPILLARY: Glucose-Capillary: 193 mg/dL — ABNORMAL HIGH (ref 70–99)

## 2023-04-18 SURGERY — IRRIGATION AND DEBRIDEMENT KNEE WITH POLY EXCHANGE
Anesthesia: Spinal | Site: Knee | Laterality: Right

## 2023-04-18 MED ORDER — CEFAZOLIN SODIUM-DEXTROSE 2-4 GM/100ML-% IV SOLN
2.0000 g | INTRAVENOUS | Status: DC
  Filled 2023-04-18: qty 100

## 2023-04-18 MED ORDER — LACTATED RINGERS IV SOLN: INTRAVENOUS | Status: DC

## 2023-04-18 MED ORDER — TRANEXAMIC ACID-NACL 1000-0.7 MG/100ML-% IV SOLN
1000.0000 mg | INTRAVENOUS | Status: DC
  Filled 2023-04-18: qty 100

## 2023-04-18 MED ORDER — CHLORHEXIDINE GLUCONATE 0.12 % MT SOLN
15.0000 mL | Freq: Once | OROMUCOSAL | Status: AC
  Administered 2023-04-18: 15 mL via OROMUCOSAL

## 2023-04-18 MED ORDER — POVIDONE-IODINE 10 % EX SWAB
2.0000 | Freq: Once | CUTANEOUS | Status: AC
  Administered 2023-04-18: 2 via TOPICAL

## 2023-04-18 MED ORDER — ORAL CARE MOUTH RINSE: 15.0000 mL | Freq: Once | OROMUCOSAL | Status: AC

## 2023-04-18 MED ORDER — ACETAMINOPHEN 500 MG PO TABS: 1000.0000 mg | ORAL_TABLET | Freq: Once | ORAL | Status: DC

## 2023-04-18 MED ORDER — ACETAMINOPHEN 500 MG PO TABS
1000.0000 mg | ORAL_TABLET | Freq: Once | ORAL | Status: AC
  Administered 2023-04-18: 1000 mg via ORAL
  Filled 2023-04-18: qty 2

## 2023-04-18 MED ORDER — POVIDONE-IODINE 10 % EX SWAB: 2.0000 | Freq: Once | CUTANEOUS | Status: DC

## 2023-04-18 NOTE — Progress Notes (Signed)
Patient states she hasn't had any pain or mechanical symptoms for the past month. I ordered a new x-ray, which demonstrates no change. Since she is no longer having symptoms, we re-discussed the R/B/A of surgery. She desires to hold off on surgery, which is more than reasonable.

## 2023-04-18 NOTE — Anesthesia Preprocedure Evaluation (Signed)
Anesthesia Evaluation    Reviewed: Allergy & Precautions, Patient's Chart, lab work & pertinent test results  History of Anesthesia Complications Negative for: history of anesthetic complications  Airway        Dental   Pulmonary sleep apnea , former smoker          Cardiovascular hypertension, Pt. on medications      Neuro/Psych  PSYCHIATRIC DISORDERS Anxiety Depression    negative neurological ROS     GI/Hepatic negative GI ROS, Neg liver ROS,,,  Endo/Other  diabetes, Type 2, Oral Hypoglycemic Agents   Obesity    Renal/GU negative Renal ROS     Musculoskeletal  (+) Arthritis  (psoriatic),    Abdominal   Peds  Hematology negative hematology ROS (+)   Anesthesia Other Findings   Reproductive/Obstetrics                              Anesthesia Physical Anesthesia Plan  ASA: 2  Anesthesia Plan: Spinal   Post-op Pain Management: Regional block* and Tylenol PO (pre-op)*   Induction:   PONV Risk Score and Plan: 2 and Treatment may vary due to age or medical condition and Propofol infusion  Airway Management Planned: Natural Airway and Simple Face Mask  Additional Equipment: None  Intra-op Plan:   Post-operative Plan:   Informed Consent:   Plan Discussed with: CRNA and Anesthesiologist  Anesthesia Plan Comments:          Anesthesia Quick Evaluation

## 2023-04-18 NOTE — Interval H&P Note (Signed)
History and Physical Interval Note:  04/18/2023 12:15 PM  Laura Trujillo  has presented today for surgery, with the diagnosis of Failed right total knee arthroplasty.  The various methods of treatment have been discussed with the patient and family. After consideration of risks, benefits and other options for treatment, the patient has consented to  Procedure(s) with comments: POLY LINER EXCHANGE KNEE (Right) - 150 as a surgical intervention.  The patient's history has been reviewed, patient examined, no change in status, stable for surgery.  I have reviewed the patient's chart and labs.  Questions were answered to the patient's satisfaction.     Iline Oven Collyn Selk

## 2023-04-18 NOTE — Progress Notes (Signed)
Surgery cancelled per Dr. Linna Caprice
